# Patient Record
Sex: Female | Born: 1975
Health system: Southern US, Community
[De-identification: ages and names within clinical notes are randomized; demographics above are authoritative.]

## PROBLEM LIST (undated history)

## (undated) ENCOUNTER — Inpatient Hospital Stay (HOSPITAL_COMMUNITY): Payer: Self-pay

## (undated) DIAGNOSIS — D649 Anemia, unspecified: Secondary | ICD-10-CM

## (undated) HISTORY — PX: BREAST SURGERY: SHX581

## (undated) HISTORY — DX: Anemia, unspecified: D64.9

## (undated) HISTORY — PX: WISDOM TOOTH EXTRACTION: SHX21

---

## 1998-05-18 ENCOUNTER — Other Ambulatory Visit: Admission: RE | Admit: 1998-05-18 | Discharge: 1998-05-18 | Payer: Self-pay | Admitting: Gynecology

## 1999-12-26 ENCOUNTER — Other Ambulatory Visit: Admission: RE | Admit: 1999-12-26 | Discharge: 1999-12-26 | Payer: Self-pay | Admitting: Gynecology

## 2001-02-12 ENCOUNTER — Other Ambulatory Visit: Admission: RE | Admit: 2001-02-12 | Discharge: 2001-02-12 | Payer: Self-pay | Admitting: Gynecology

## 2002-03-05 ENCOUNTER — Other Ambulatory Visit: Admission: RE | Admit: 2002-03-05 | Discharge: 2002-03-05 | Payer: Self-pay | Admitting: Gynecology

## 2003-03-12 ENCOUNTER — Other Ambulatory Visit: Admission: RE | Admit: 2003-03-12 | Discharge: 2003-03-12 | Payer: Self-pay | Admitting: Gynecology

## 2003-12-07 ENCOUNTER — Other Ambulatory Visit: Admission: RE | Admit: 2003-12-07 | Discharge: 2003-12-07 | Payer: Self-pay | Admitting: Gynecology

## 2005-04-24 ENCOUNTER — Ambulatory Visit (HOSPITAL_COMMUNITY): Admission: RE | Admit: 2005-04-24 | Discharge: 2005-04-24 | Payer: Self-pay | Admitting: Obstetrics and Gynecology

## 2005-05-17 ENCOUNTER — Inpatient Hospital Stay (HOSPITAL_COMMUNITY): Admission: AD | Admit: 2005-05-17 | Discharge: 2005-05-17 | Payer: Self-pay | Admitting: Obstetrics and Gynecology

## 2005-06-11 ENCOUNTER — Inpatient Hospital Stay (HOSPITAL_COMMUNITY): Admission: AD | Admit: 2005-06-11 | Discharge: 2005-06-11 | Payer: Self-pay | Admitting: Obstetrics and Gynecology

## 2005-06-12 ENCOUNTER — Inpatient Hospital Stay (HOSPITAL_COMMUNITY): Admission: AD | Admit: 2005-06-12 | Discharge: 2005-06-12 | Payer: Self-pay | Admitting: Obstetrics and Gynecology

## 2005-06-16 ENCOUNTER — Inpatient Hospital Stay (HOSPITAL_COMMUNITY): Admission: AD | Admit: 2005-06-16 | Discharge: 2005-06-16 | Payer: Self-pay | Admitting: Obstetrics and Gynecology

## 2005-06-25 ENCOUNTER — Inpatient Hospital Stay (HOSPITAL_COMMUNITY): Admission: RE | Admit: 2005-06-25 | Discharge: 2005-06-28 | Payer: Self-pay | Admitting: Obstetrics and Gynecology

## 2005-06-26 ENCOUNTER — Encounter (INDEPENDENT_AMBULATORY_CARE_PROVIDER_SITE_OTHER): Payer: Self-pay | Admitting: *Deleted

## 2005-07-31 ENCOUNTER — Other Ambulatory Visit: Admission: RE | Admit: 2005-07-31 | Discharge: 2005-07-31 | Payer: Self-pay | Admitting: Obstetrics and Gynecology

## 2006-01-07 IMAGING — US US FETAL BPP W/O NONSTRESS
1 series · 14 of 20 positions shown · non-contrast
Comparison: none

CLINICAL DATA: 29-year-old.  Intrauterine growth retardation.  Biophysical profile.

[Series 1: us fetal bpp w/o nonstress · 0.41mm/px · 14 of 20 slices shown]
[im 1/20]
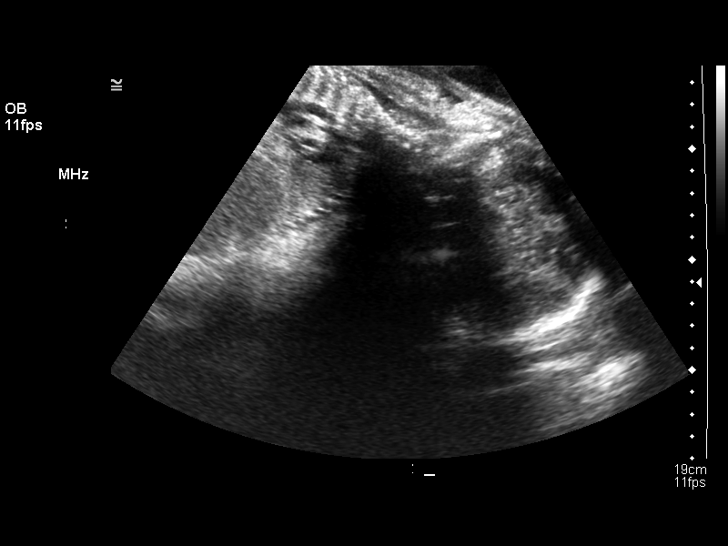
[im 3/20]
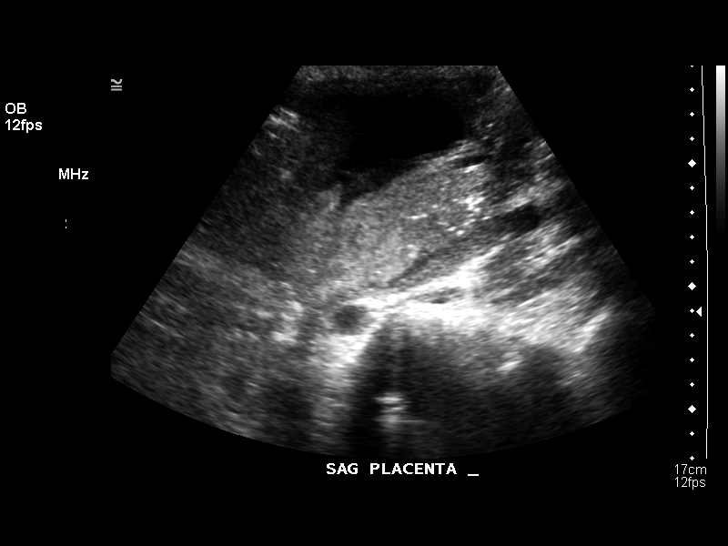
[im 4/20]
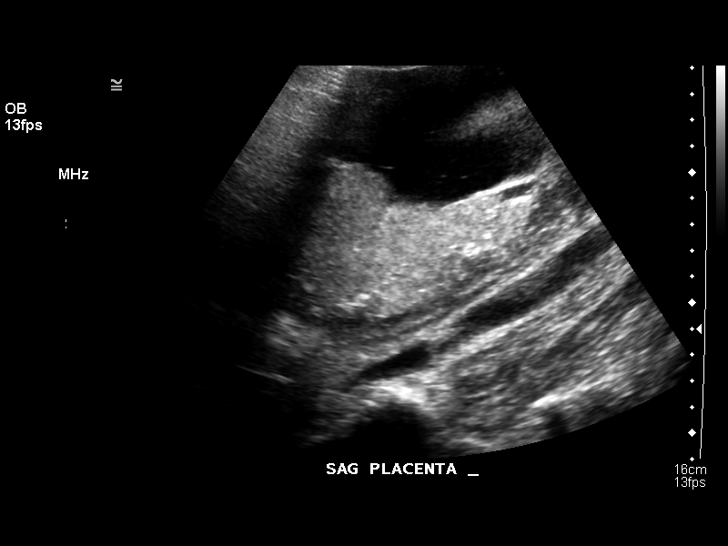
[im 6/20]
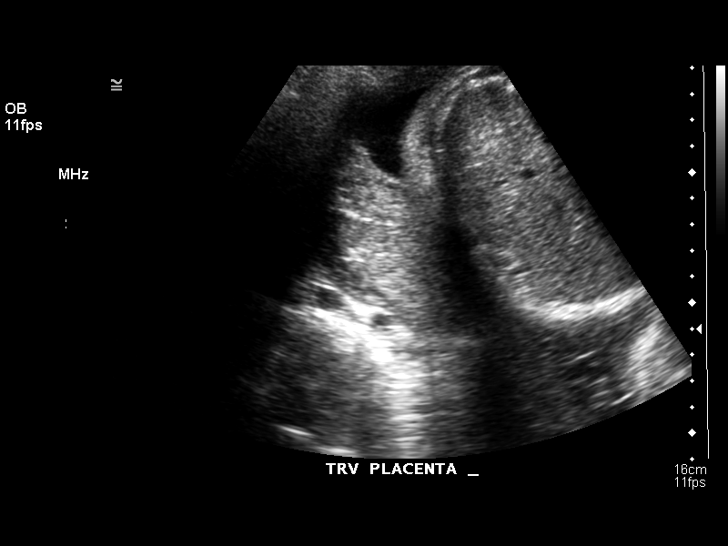
[im 7/20]
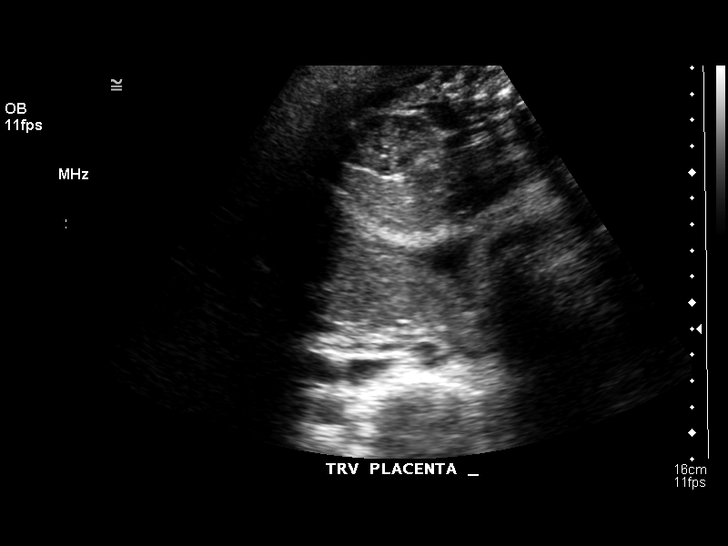
[im 8/20]
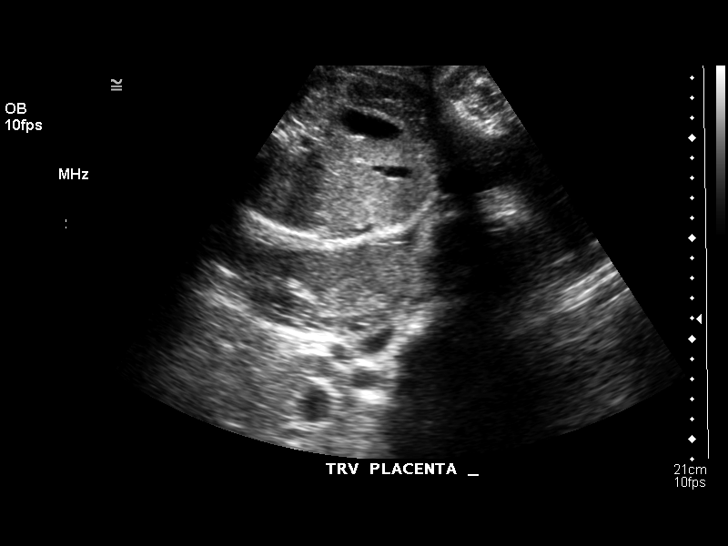
[im 10/20]
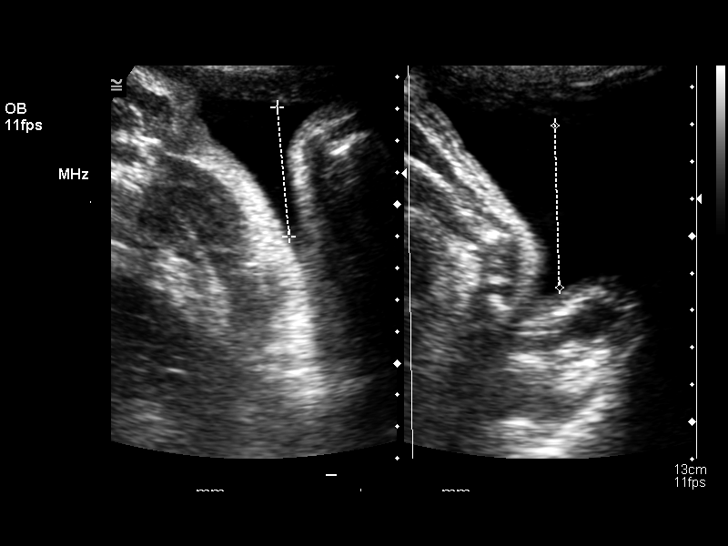
[im 11/20]
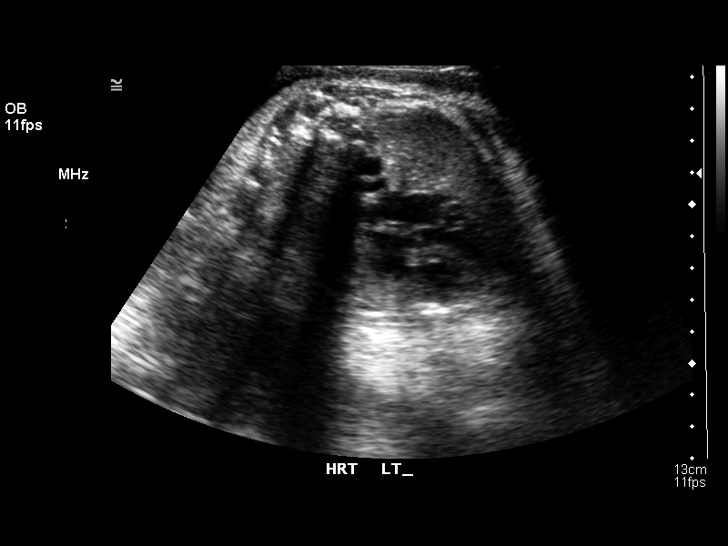
[im 13/20]
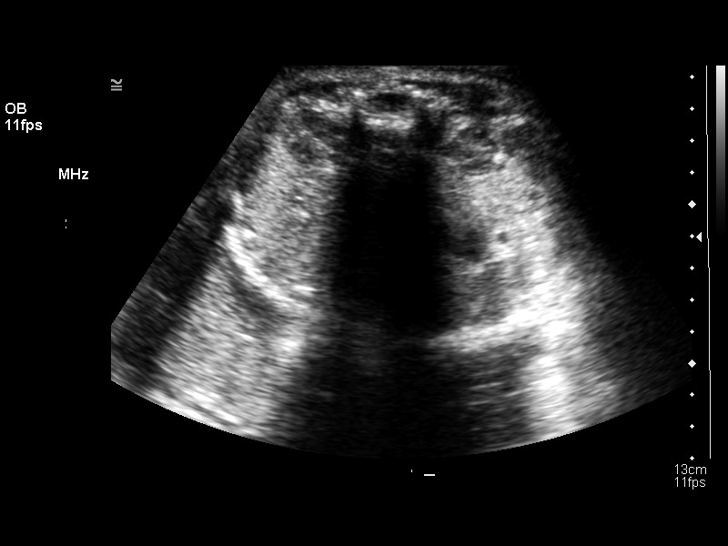
[im 14/20]
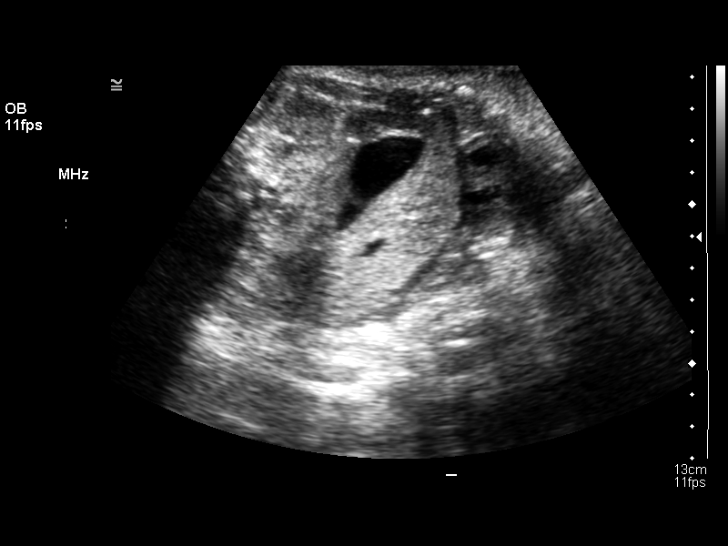
[im 16/20]
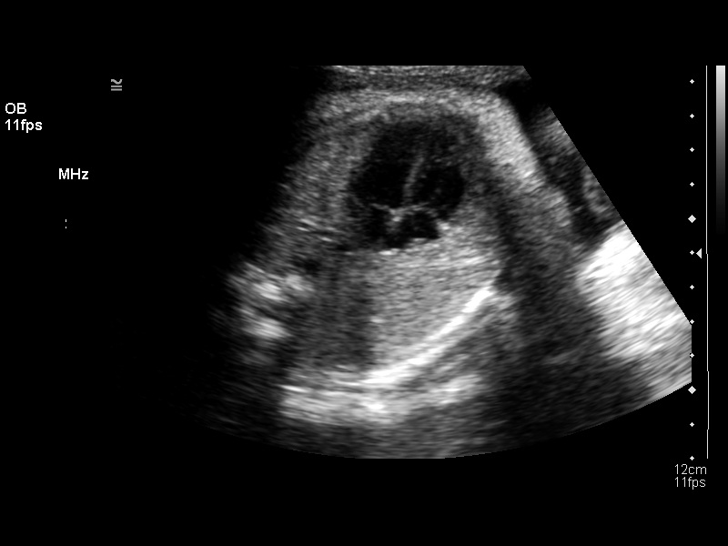
[im 17/20]
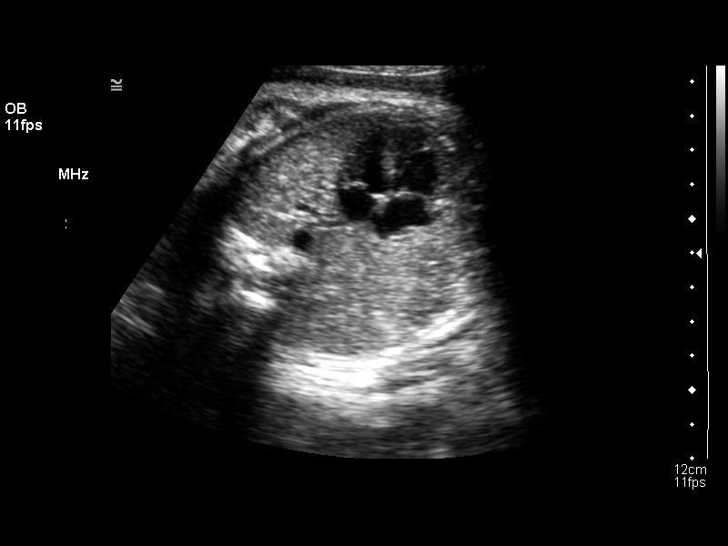
[im 18/20]
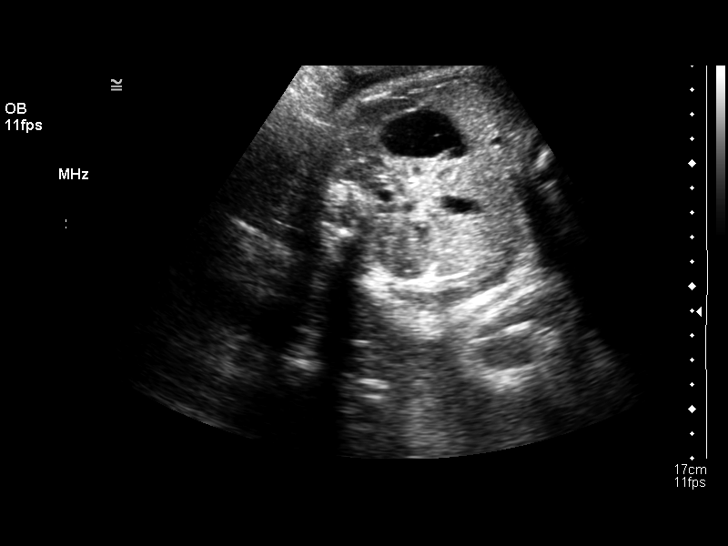
[im 20/20]
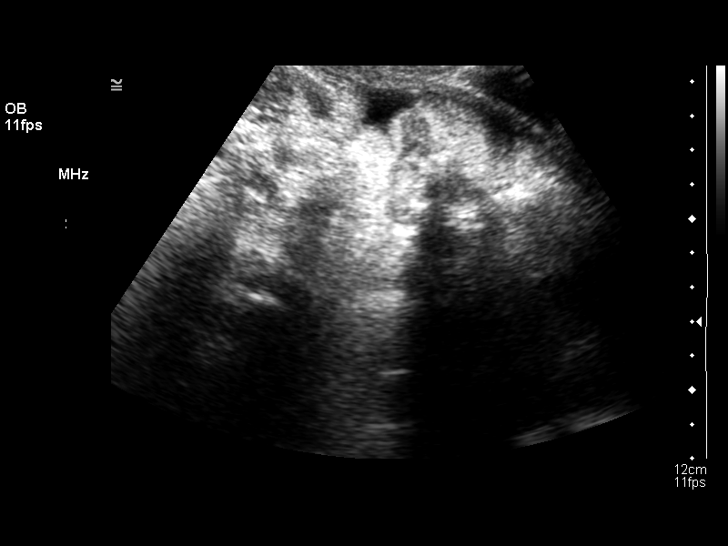

[14 of 20 positions shown; findings below may reference images not displayed]

BIOPHYSICAL PROFILE

 Number of Fetuses:  1
 Heart rate:  153
 Movement:  Yes
 Breathing:  Yes
 Presentation:  Cephalic
 Placental Location:  Posterior
 Grade:  I
 Previa:  No
 Amniotic Fluid (Subjective):  Normal
 Amniotic Fluid (Objective):  15.5 cm AFI (5th -95th%ile = 7.5 ? 24.4 cm for 37 wks)

 Fetal measurements and complete anatomic evaluation were not requested.  The following fetal anatomy was visualized on this exam:  Four chamber heart, stomach, kidneys, bladder and diaphragm.  

 BPP SCORING
 Movements:  2  Time:  20 minutes
 Breathing:  2
 Tone:  2
 Amniotic Fluid:  2
 Total Score:  8

 MATERNAL UTERINE AND ADNEXAL FINDINGS
 Cervix:  Not evaluated.
IMPRESSION: 1.  Biophysical profile score [DATE] at 20 minutes.
 2.  Living intrauterine fetus estimated at 36 weeks.

## 2006-01-11 IMAGING — US US OB FOLLOW-UP
1 series · 13 of 27 positions shown · non-contrast
Comparison: none

CLINICAL DATA: Assigned gestational age is 37 weeks 1 day; patient with UTI.

[Series 1: us ob follow-up · 0.31mm/px · 13 of 27 slices shown]
[im 2/27]
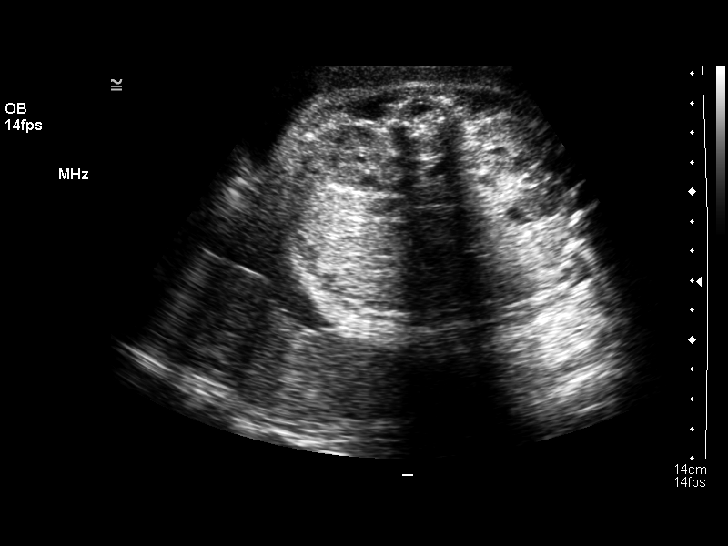
[im 4/27]
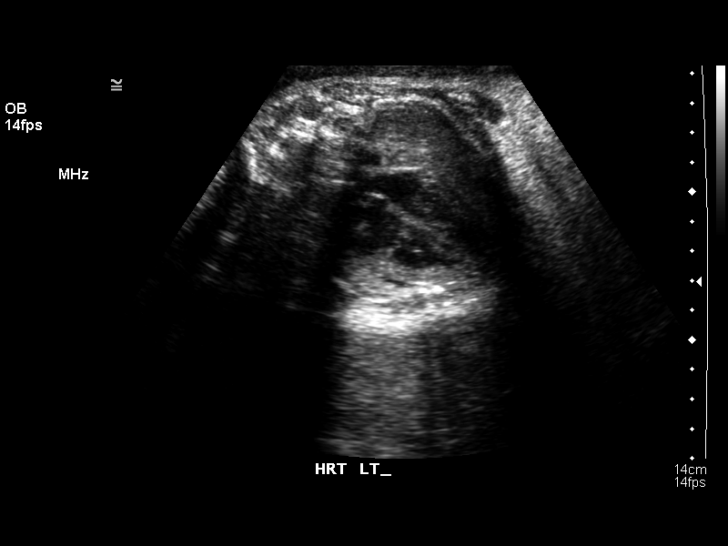
[im 6/27]
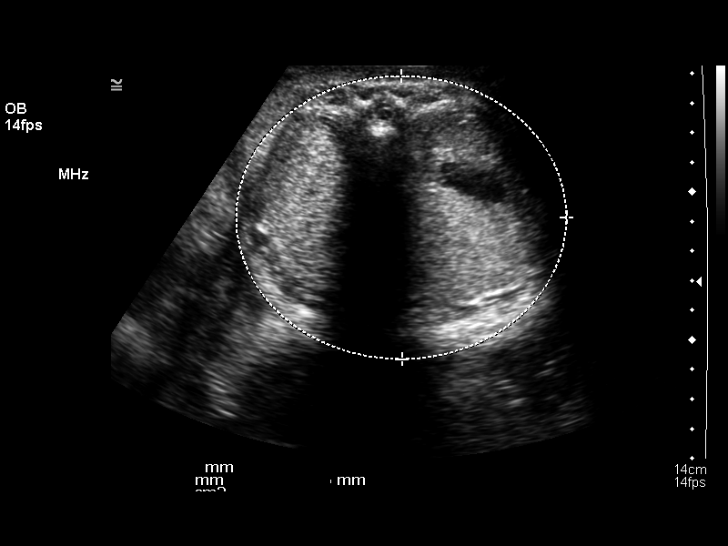
[im 8/27]
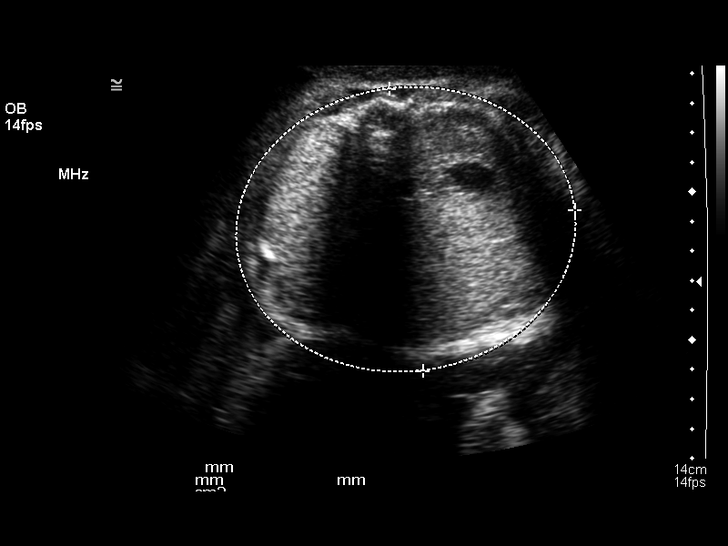
[im 10/27]
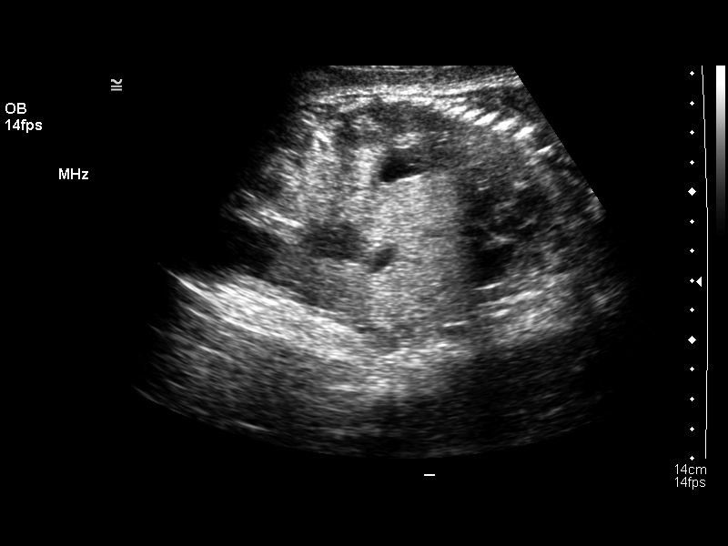
[im 12/27]
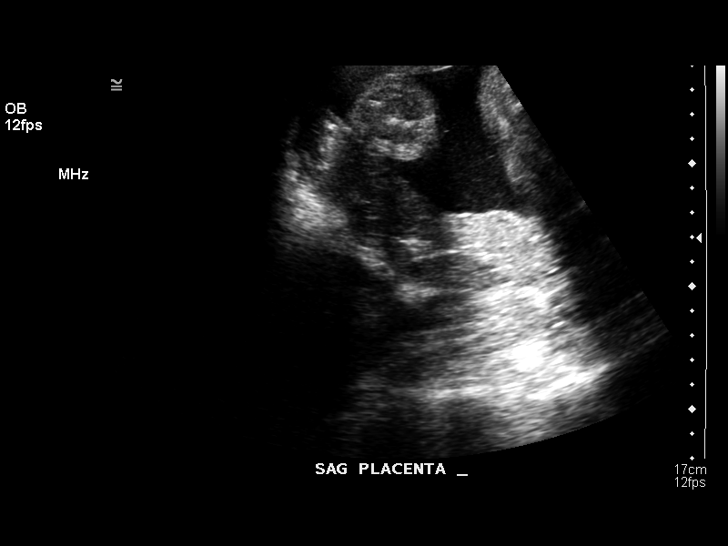
[im 14/27]
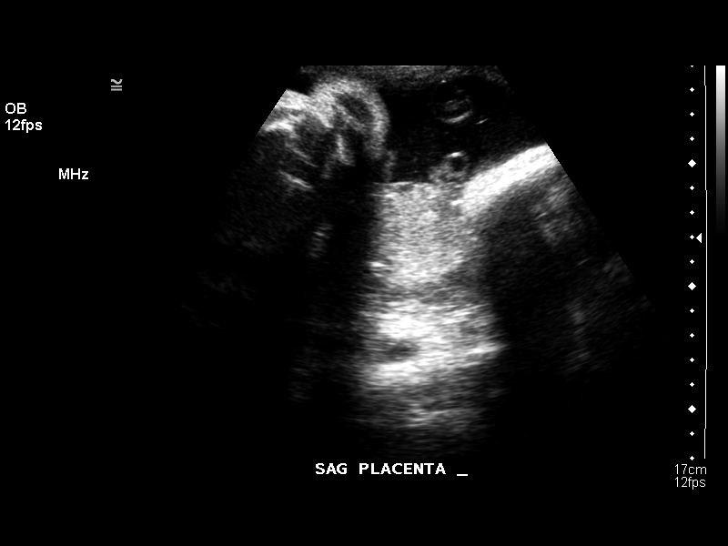
[im 16/27]
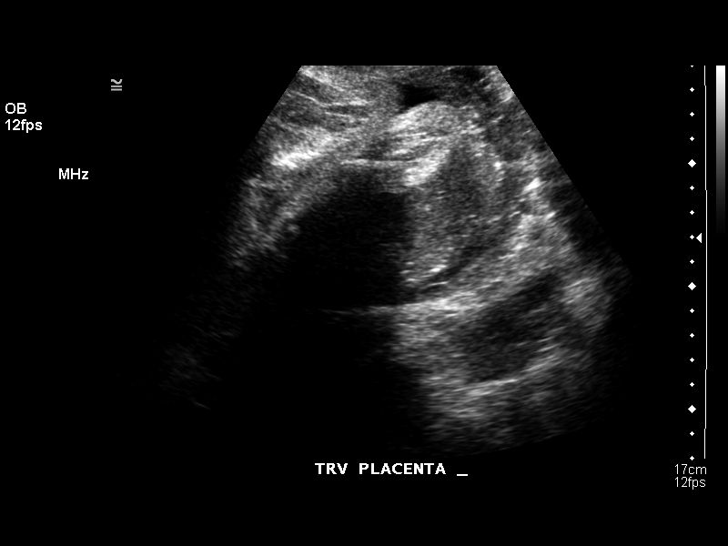
[im 18/27]
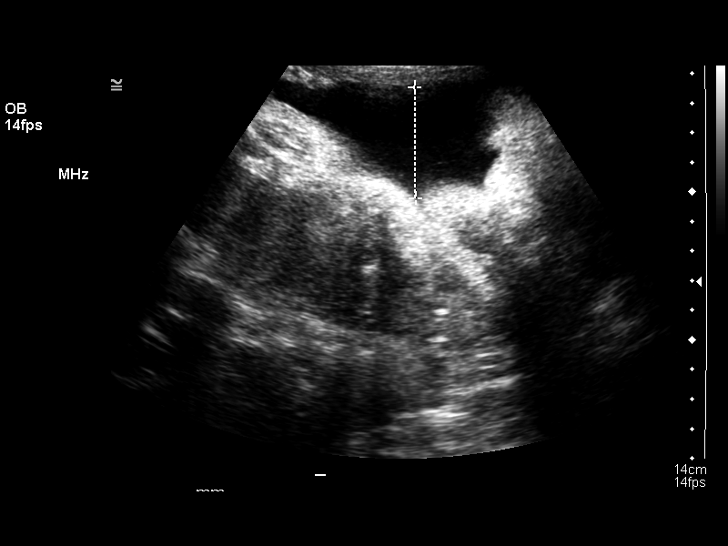
[im 20/27]
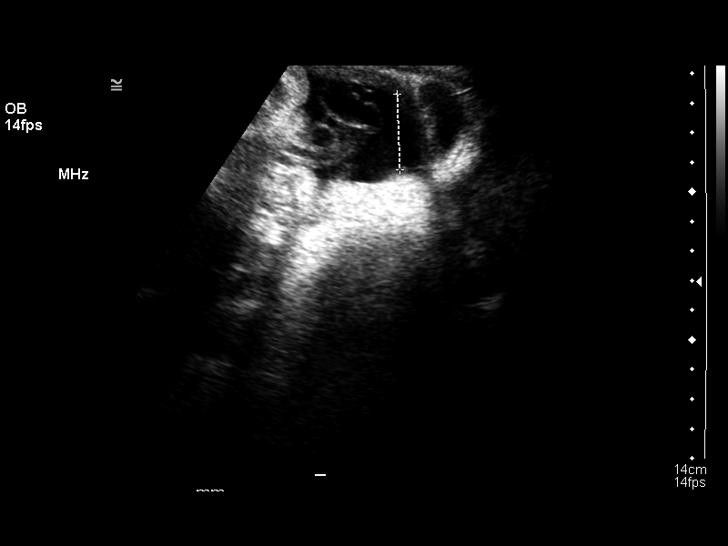
[im 22/27]
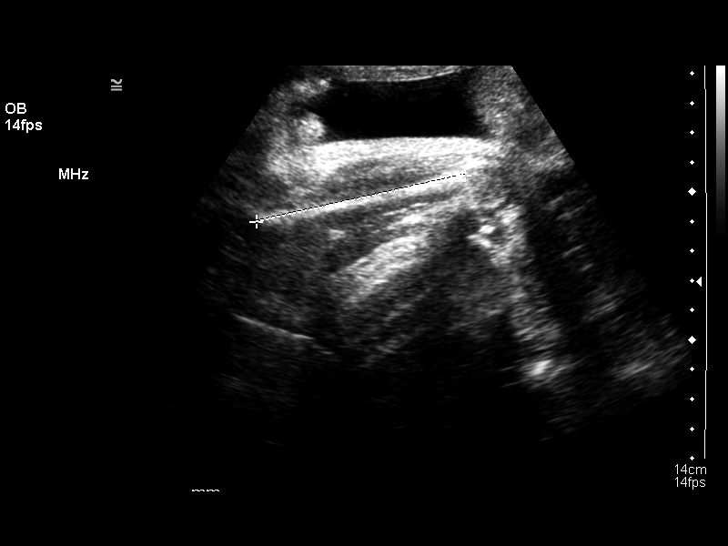
[im 24/27]
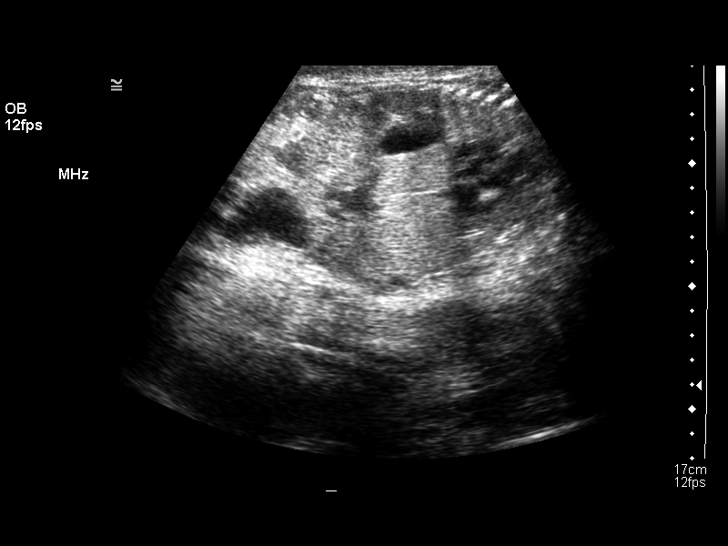
[im 26/27]
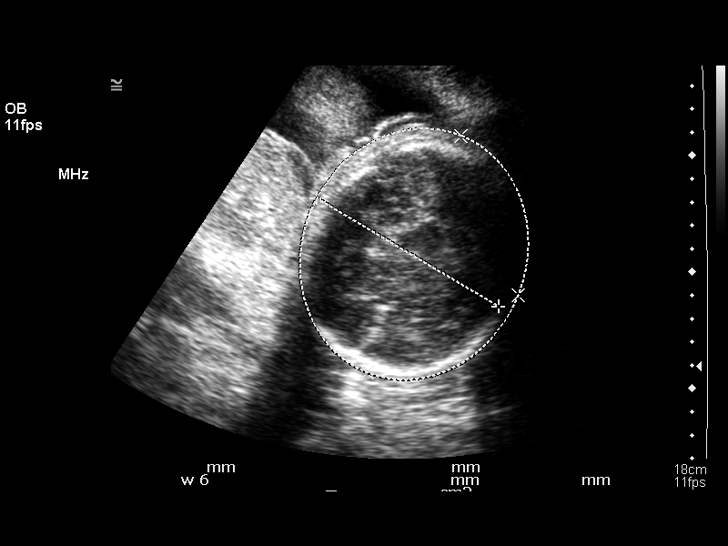

[13 of 27 positions shown; findings below may reference images not displayed]

OBSTETRICAL ULTRASOUND RE-EVALUATION:
 Number of Fetuses:  1
 Heart Rate:  139
 Movement:  Yes
 Breathing:  Yes
 Presentation:  Cephalic
 Placental Location:  Posterior
 Grade:  II
 Previa:  No
 Amniotic Fluid (subjective):  Normal
 Amniotic Fluid (objective):  10.7 cm AFI (5th -95th%ile = 7.5 – 24.4 cm for 37 wks)

 FETAL BIOMETRY
 BPD:  9.0 cm   36 w 4 d
 HC:  32.1 cm  36 w 1 d
 AC:  33.2 cm  37 w 0 d
 FL:   7.4 cm   37 w 5 d

 Mean GA:  36 w 6 d
 Assigned GA:  37 w 1 d

 EFW:  8029 g (H) 50th – 75th%ile (0126 – 5211 g) For 37 wks

 FETAL ANATOMY
 Lateral Ventricles:  Not visualized 
 Thalami/CSP:  Not visualized 
 Posterior Fossa:  Not visualized 
 Nuchal Region:  N/A
 Spine:  Not visualized 
 4 Chamber Heart on Left:  Previously seen 
 Stomach on Left:  Visualized 
 3 Vessel Cord:  Previously seen 
 Cord Insertion Site:  Not visualized 
 Kidneys:  Visualized 
 Bladder:  Visualized 
 Extremities:  Not visualized 

 Evaluation limited by:  Fetal position and advanced gestational age.

 MATERNAL UTERINE AND ADNEXAL FINDINGS
 Cervix:  Not evaluated.

 BIOPHYSICAL PROFILE

 Movement:  2    Time:  30 minutes
 Breathing:  2
 Tone:  2
 Amniotic Fluid:  2

 Total Score:  8
IMPRESSION: 1.  There is a single living intrauterine gestation in cephalic presentation.  The estimated fetal weight is 8029 + / - 462 grams which is at the 50th – 75th percentile for a 37 week gestation.  The fetal indices are concordant. 
 2.  The biophysical score is [DATE] over a 30 minute period.

## 2009-12-14 ENCOUNTER — Ambulatory Visit (HOSPITAL_COMMUNITY): Admission: RE | Admit: 2009-12-14 | Discharge: 2009-12-14 | Payer: Self-pay | Admitting: Obstetrics & Gynecology

## 2010-03-03 ENCOUNTER — Inpatient Hospital Stay (HOSPITAL_COMMUNITY): Admission: RE | Admit: 2010-03-03 | Discharge: 2010-03-03 | Payer: Self-pay | Admitting: Obstetrics & Gynecology

## 2010-05-13 ENCOUNTER — Inpatient Hospital Stay (HOSPITAL_COMMUNITY): Admission: RE | Admit: 2010-05-13 | Discharge: 2010-05-15 | Payer: Self-pay | Admitting: Obstetrics

## 2010-11-19 ENCOUNTER — Encounter: Payer: Self-pay | Admitting: Gynecology

## 2010-11-20 ENCOUNTER — Encounter: Payer: Self-pay | Admitting: Obstetrics

## 2011-01-14 LAB — CBC
HCT: 35.3 % — ABNORMAL LOW (ref 36.0–46.0)
Hemoglobin: 9.7 g/dL — ABNORMAL LOW (ref 12.0–15.0)
MCH: 33 pg (ref 26.0–34.0)
MCH: 33.6 pg (ref 26.0–34.0)
MCHC: 34.4 g/dL (ref 30.0–36.0)
MCV: 96 fL (ref 78.0–100.0)
MCV: 96.7 fL (ref 78.0–100.0)
RBC: 2.89 MIL/uL — ABNORMAL LOW (ref 3.87–5.11)
RDW: 13 % (ref 11.5–15.5)
WBC: 8.1 10*3/uL (ref 4.0–10.5)

## 2011-03-17 NOTE — Op Note (Signed)
NAMESANAAI, DOANE               ACCOUNT NO.:  [id number]   MEDICAL RECORD NO.:  [id number]          PATIENT TYPE:  INP   LOCATION:  9107                          FACILITY:  [facility]   PHYSICIAN:  Guy Sandifer. Henderson Cloud, M.D. DATE OF BIRTH:  1976-06-30   DATE OF PROCEDURE:  06/26/2005  DATE OF DISCHARGE:                                 OPERATIVE REPORT   PROCEDURE:  Vacuum extraction.   INDICATIONS AND CONSENT:  This patient is a 36 year old married white  female, G1, P0 with an EDC of July 06, 2005, placing her at 38-1/2  weeks' estimated gestational age.  She was admitted the previous evening for  a two-stage induction for a pregnancy complicated by poor weight gain,  prodromal labor, and history of depression.  Group B beta strep is positive,  and she is receiving antibiotics per protocol.  The patient received Cytotec  overnight.  The following morning, Pitocin is started per protocol.  She is  3 cm and requests pain relief, and an epidural is placed.  At approximately  9 a.m., she is noted to have some variable decelerations with contractions.  In preparing for an examination and artificial rupture of membranes, the bag  of waters is noted to be prolapsing through the introitus, and the vertex is  at +3 station.  The patient is then set up for delivery.  During this  process, the variable decelerations developed late components.  Artificial  rupture of membranes for clear fluid is carried out.  The patient pushes 2-3  times, and the vertex is essentially crowning.  The fetal heart rate remains  in the 80s.  Oxygen is being administered.  Due to the persistent fetal  bradycardia, vacuum extraction is recommended and discussed with the patient  and her husband.  One in 40,000 risk of severe morbidity or mortality is  discussed.  Kiwi vacuum extractor was placed and over the course of two  uterine contractions with a two-finger gentle pull, the vertex is delivered  without difficulty.   There are no pop-offs.  Oronasal pharynx was suctioned.  The remainder of the baby is delivered.  Viable female infant, Apgars of 9 and  9 at one and five minutes respectively is obtained.  Arterial cord pH and  birth weight are pending at time of dictation.  Placenta is three vessels  and intact and sent to pathology.  Cervix and vagina without laceration.  Second-degree midline episiotomy is repaired.  The patient and infant are  stable in the labor and delivery room.      Guy Sandifer Henderson Cloud, M.D.  Electronically Signed     JET/MEDQ  D:  06/26/2005  T:  06/26/2005  Job:  [id number]

## 2012-02-06 ENCOUNTER — Other Ambulatory Visit: Payer: Self-pay | Admitting: Plastic Surgery

## 2013-11-06 ENCOUNTER — Ambulatory Visit: Payer: Self-pay | Admitting: Obstetrics

## 2013-11-10 ENCOUNTER — Ambulatory Visit (INDEPENDENT_AMBULATORY_CARE_PROVIDER_SITE_OTHER): Payer: BC Managed Care – PPO | Admitting: Obstetrics

## 2013-11-10 ENCOUNTER — Encounter: Payer: Self-pay | Admitting: Obstetrics

## 2013-11-10 VITALS — BP 98/62 | HR 57 | Temp 98.4°F | Ht 67.0 in | Wt 100.0 lb

## 2013-11-10 DIAGNOSIS — Z01419 Encounter for gynecological examination (general) (routine) without abnormal findings: Secondary | ICD-10-CM

## 2013-11-10 DIAGNOSIS — Z30432 Encounter for removal of intrauterine contraceptive device: Secondary | ICD-10-CM

## 2013-11-10 DIAGNOSIS — Z3169 Encounter for other general counseling and advice on procreation: Secondary | ICD-10-CM

## 2013-11-10 DIAGNOSIS — Z0000 Encounter for general adult medical examination without abnormal findings: Secondary | ICD-10-CM

## 2013-11-10 NOTE — Progress Notes (Signed)
Subjective:     Alison Greer is a 38 y.o. female here for a routine exam.  Current complaints: Patient is in the office today for annual exam. Patient reports she has no problems today.  Desires removal of IUD.  Wants another child.   Gynecologic History No LMP recorded. Patient is not currently having periods (Reason: IUD). Contraception: Paragard Last Pap: 1 year. Results were: normal   Obstetric History OB History  No data available     The following portions of the patient's history were reviewed and updated as appropriate: allergies, current medications, past family history, past medical history, past social history, past surgical history and problem list.  Review of Systems Pertinent items are noted in HPI.    Objective:    General appearance: alert and no distress Breasts: normal appearance, no masses or tenderness, Normal to palpation without dominant masses Abdomen: normal findings: soft, non-tender Pelvic: cervix normal in appearance, external genitalia normal, no adnexal masses or tenderness, no cervical motion tenderness, rectovaginal septum normal, uterus normal size, shape, and consistency and vagina normal without discharge   IUD string visible and grasped with dressing forcep and removed, intact.   Assessment:    Healthy female exam.   IUD surveillance / removal.   Plan:    Doing well.  IUD removed.  F/U 1 year.  Preconception counseling done.

## 2013-11-10 NOTE — Addendum Note (Signed)
Addended by: Elby BeckPAUL, Jullianna Gabor F on: 11/10/2013 04:08 PM   Modules accepted: Orders

## 2013-11-11 LAB — WET PREP BY MOLECULAR PROBE
Candida species: NEGATIVE
Gardnerella vaginalis: POSITIVE — AB
Trichomonas vaginosis: NEGATIVE

## 2013-11-11 LAB — PAP IG W/ RFLX HPV ASCU

## 2013-11-11 LAB — GC/CHLAMYDIA PROBE AMP
CT Probe RNA: NEGATIVE
GC PROBE AMP APTIMA: NEGATIVE

## 2013-11-25 LAB — ANAEROBIC CULTURE
Gram Stain: NONE SEEN
Gram Stain: NONE SEEN

## 2013-12-02 ENCOUNTER — Other Ambulatory Visit: Payer: Self-pay | Admitting: *Deleted

## 2013-12-02 DIAGNOSIS — N760 Acute vaginitis: Principal | ICD-10-CM

## 2013-12-02 DIAGNOSIS — B9689 Other specified bacterial agents as the cause of diseases classified elsewhere: Secondary | ICD-10-CM

## 2013-12-02 MED ORDER — METRONIDAZOLE 500 MG PO TABS: 500.0000 mg | ORAL_TABLET | Freq: Two times a day (BID) | ORAL | Status: DC

## 2013-12-03 ENCOUNTER — Encounter: Payer: Self-pay | Admitting: Obstetrics

## 2015-03-01 ENCOUNTER — Ambulatory Visit: Payer: Self-pay | Admitting: Obstetrics

## 2015-03-22 ENCOUNTER — Encounter: Payer: Self-pay | Admitting: Obstetrics

## 2015-03-22 ENCOUNTER — Ambulatory Visit (INDEPENDENT_AMBULATORY_CARE_PROVIDER_SITE_OTHER): Payer: 59 | Admitting: Obstetrics

## 2015-03-22 VITALS — BP 96/54 | HR 74 | Temp 98.7°F | Ht 67.5 in | Wt 100.0 lb

## 2015-03-22 DIAGNOSIS — N898 Other specified noninflammatory disorders of vagina: Secondary | ICD-10-CM

## 2015-03-22 DIAGNOSIS — N911 Secondary amenorrhea: Secondary | ICD-10-CM

## 2015-03-22 LAB — POCT URINALYSIS DIPSTICK
BILIRUBIN UA: NEGATIVE
Blood, UA: NEGATIVE
GLUCOSE UA: NEGATIVE
KETONES UA: NEGATIVE
Leukocytes, UA: NEGATIVE
NITRITE UA: NEGATIVE
Protein, UA: NEGATIVE
Spec Grav, UA: <=1.005
Urobilinogen, UA: NEGATIVE
pH, UA: 7.5

## 2015-03-22 LAB — POCT URINE PREGNANCY: PREG TEST UR: POSITIVE

## 2015-03-22 NOTE — Progress Notes (Signed)
Patient ID: Alison Greer, female   DOB: 04/01/1976, 39 y.o.   MRN: [id number]  Chief Complaint  Patient presents with  . Problem    HPI Alison Greer is a 39 y.o. female.  No period since March.  Tired.  Urinary frequency.  HPI  History reviewed. No pertinent past medical history.  History reviewed. No pertinent past surgical history.  History reviewed. No pertinent family history.  Social History History  Substance Use Topics  . Smoking status: Never Smoker   . Smokeless tobacco: Not on file  . Alcohol Use: No     Comment: rare    Allergies  Allergen Reactions  . Azithromycin Nausea And Vomiting    Current Outpatient Prescriptions  Medication Sig Dispense Refill  . mirtazapine (REMERON) 15 MG tablet Take 15 mg by mouth at bedtime. Half of a tablet daily     No current facility-administered medications for this visit.    Review of Systems Review of Systems Constitutional: negative for fatigue and weight loss Respiratory: negative for cough and wheezing Cardiovascular: negative for chest pain, fatigue and palpitations Gastrointestinal: negative for abdominal pain and change in bowel habits Genitourinary:negative Integument/breast: negative for nipple discharge Musculoskeletal:negative for myalgias Neurological: negative for gait problems and tremors Behavioral/Psych: negative for abusive relationship, depression Endocrine: negative for temperature intolerance     Blood pressure 96/54, pulse 74, temperature 98.7 F (37.1 C), height 5' 7.5" (1.715 m), weight 100 lb (45.36 kg), last menstrual period 12/30/2014.  Physical Exam Physical Exam: Abdomen:  normal findings: no organomegaly, soft, non-tender and no hernia.  Negative FHR with Doppler.  Pelvis:  External genitalia: normal general appearance Urinary system: urethral meatus normal and bladder without fullness, nontender Vaginal: normal without tenderness, induration or masses Cervix: normal  appearance Adnexa: normal bimanual exam Uterus: anteverted and non-tender, soft, slightly enlarged      Data Reviewed UPT - positive  Assessment     Early pregnancy.  Unsure LMP.  AMA      Plan    Ultrasound ordered. F/U 2 weeks for NOB visit.

## 2015-03-22 NOTE — Addendum Note (Signed)
Addended by: Henriette CombsHATTON, Keilyn Haggard L on: 03/22/2015 04:42 PM   Modules accepted: Orders

## 2015-03-25 ENCOUNTER — Other Ambulatory Visit: Payer: 59

## 2015-03-25 ENCOUNTER — Ambulatory Visit (INDEPENDENT_AMBULATORY_CARE_PROVIDER_SITE_OTHER): Payer: 59

## 2015-03-25 ENCOUNTER — Other Ambulatory Visit: Payer: Self-pay | Admitting: Obstetrics

## 2015-03-25 DIAGNOSIS — N912 Amenorrhea, unspecified: Secondary | ICD-10-CM

## 2015-03-25 DIAGNOSIS — N911 Secondary amenorrhea: Secondary | ICD-10-CM

## 2015-03-25 LAB — US OB TRANSVAGINAL

## 2015-03-25 LAB — SURESWAB, VAGINOSIS/VAGINITIS PLUS
ATOPOBIUM VAGINAE: NOT DETECTED Log (cells/mL)
C. GLABRATA, DNA: NOT DETECTED
C. TRACHOMATIS RNA, TMA: NOT DETECTED
C. TROPICALIS, DNA: NOT DETECTED
C. albicans, DNA: NOT DETECTED
C. parapsilosis, DNA: NOT DETECTED
Gardnerella vaginalis: 7.9 Log (cells/mL)
LACTOBACILLUS SPECIES: NOT DETECTED Log (cells/mL)
MEGASPHAERA SPECIES: NOT DETECTED Log (cells/mL)
N. gonorrhoeae RNA, TMA: NOT DETECTED
T. VAGINALIS RNA, QL TMA: NOT DETECTED

## 2015-03-26 ENCOUNTER — Telehealth: Payer: Self-pay | Admitting: *Deleted

## 2015-03-26 ENCOUNTER — Other Ambulatory Visit: Payer: Self-pay | Admitting: Obstetrics

## 2015-03-26 DIAGNOSIS — B9689 Other specified bacterial agents as the cause of diseases classified elsewhere: Secondary | ICD-10-CM

## 2015-03-26 DIAGNOSIS — N760 Acute vaginitis: Principal | ICD-10-CM

## 2015-03-26 LAB — HCG, QUANTITATIVE, PREGNANCY: hCG, Beta Chain, Quant, S: 16201 m[IU]/mL

## 2015-03-26 MED ORDER — CLINDAMYCIN HCL 300 MG PO CAPS: 300.0000 mg | ORAL_CAPSULE | Freq: Three times a day (TID) | ORAL | Status: DC

## 2015-03-26 NOTE — Telephone Encounter (Signed)
Patient requested her Hcg results. Patient advised of test results. Patient advised Dr. Clearance CootsHarper would like for her to come in Tuesday or Wednesday to repeat the blood work. Patient advised that she would also have a follow up ultrasound in a couple weeks. Patient advised she should receive a call to set up that appointment. Patient verbalized understanding.

## 2015-03-31 ENCOUNTER — Encounter: Payer: Self-pay | Admitting: Obstetrics

## 2015-03-31 ENCOUNTER — Other Ambulatory Visit: Payer: 59

## 2015-03-31 DIAGNOSIS — Z3200 Encounter for pregnancy test, result unknown: Secondary | ICD-10-CM

## 2015-04-01 ENCOUNTER — Other Ambulatory Visit: Payer: Self-pay | Admitting: Obstetrics

## 2015-04-01 DIAGNOSIS — Z3687 Encounter for antenatal screening for uncertain dates: Secondary | ICD-10-CM

## 2015-04-01 LAB — HCG, QUANTITATIVE, PREGNANCY: hCG, Beta Chain, Quant, S: 58663.9 m[IU]/mL

## 2015-04-07 ENCOUNTER — Encounter: Payer: 59 | Admitting: Obstetrics

## 2015-04-08 ENCOUNTER — Ambulatory Visit (INDEPENDENT_AMBULATORY_CARE_PROVIDER_SITE_OTHER): Payer: 59

## 2015-04-08 ENCOUNTER — Ambulatory Visit (INDEPENDENT_AMBULATORY_CARE_PROVIDER_SITE_OTHER): Payer: 59 | Admitting: Obstetrics

## 2015-04-08 ENCOUNTER — Encounter: Payer: Self-pay | Admitting: Obstetrics

## 2015-04-08 VITALS — BP 102/69 | HR 87 | Temp 98.2°F | Wt 100.0 lb

## 2015-04-08 DIAGNOSIS — Z36 Encounter for antenatal screening of mother: Secondary | ICD-10-CM | POA: Diagnosis not present

## 2015-04-08 DIAGNOSIS — Z3481 Encounter for supervision of other normal pregnancy, first trimester: Secondary | ICD-10-CM

## 2015-04-08 DIAGNOSIS — O09521 Supervision of elderly multigravida, first trimester: Secondary | ICD-10-CM

## 2015-04-08 DIAGNOSIS — Z3687 Encounter for antenatal screening for uncertain dates: Secondary | ICD-10-CM

## 2015-04-08 LAB — US OB TRANSVAGINAL

## 2015-04-08 MED ORDER — OB COMPLETE GOLD 27.5-1-200 MG PO CAPS: 1.0000 | ORAL_CAPSULE | Freq: Every day | ORAL | Status: DC

## 2015-04-09 ENCOUNTER — Encounter: Payer: Self-pay | Admitting: Obstetrics

## 2015-04-09 LAB — OBSTETRIC PANEL
Antibody Screen: NEGATIVE
BASOS ABS: 0 10*3/uL (ref 0.0–0.1)
Basophils Relative: 0 % (ref 0–1)
Eosinophils Absolute: 0 10*3/uL (ref 0.0–0.7)
Eosinophils Relative: 1 % (ref 0–5)
HCT: 37.9 % (ref 36.0–46.0)
HEMOGLOBIN: 13 g/dL (ref 12.0–15.0)
Hepatitis B Surface Ag: NEGATIVE
LYMPHS ABS: 1.1 10*3/uL (ref 0.7–4.0)
LYMPHS PCT: 24 % (ref 12–46)
MCH: 31.1 pg (ref 26.0–34.0)
MCHC: 34.3 g/dL (ref 30.0–36.0)
MCV: 90.7 fL (ref 78.0–100.0)
MONO ABS: 0.3 10*3/uL (ref 0.1–1.0)
MONOS PCT: 6 % (ref 3–12)
MPV: 10.2 fL (ref 8.6–12.4)
NEUTROS ABS: 3 10*3/uL (ref 1.7–7.7)
Neutrophils Relative %: 69 % (ref 43–77)
PLATELETS: 167 10*3/uL (ref 150–400)
RBC: 4.18 MIL/uL (ref 3.87–5.11)
RDW: 13.1 % (ref 11.5–15.5)
RH TYPE: NEGATIVE
RUBELLA: 17.3 {index} — AB (ref <0.90)
WBC: 4.4 10*3/uL (ref 4.0–10.5)

## 2015-04-09 LAB — VITAMIN D 25 HYDROXY (VIT D DEFICIENCY, FRACTURES): Vit D, 25-Hydroxy: 37 ng/mL (ref 30–100)

## 2015-04-09 LAB — HIV ANTIBODY (ROUTINE TESTING W REFLEX): HIV: NONREACTIVE

## 2015-04-09 LAB — VARICELLA ZOSTER ANTIBODY, IGG: Varicella IgG: >4000 Index — ABNORMAL HIGH (ref <135.00)

## 2015-04-09 NOTE — Progress Notes (Signed)
Subjective:    Alison Greer is being seen today for her first obstetrical visit.  This is not a planned pregnancy. She is at [redacted]w[redacted]d gestation. Her obstetrical history is significant for advanced maternal age and low BMI. Relationship with FOB: unknown. Patient does intend to breast feed. Pregnancy history fully reviewed.  The information documented in the HPI was reviewed and verified.  Menstrual History: OB History    Gravida Para Term Preterm AB TAB SAB Ectopic Multiple Living   Patient's last menstrual period was 12/30/2014.    Past Medical History  Diagnosis Date  . Anemia     Past Surgical History  Procedure Laterality Date  . Breast surgery    . Wisdom tooth extraction       (Not in a hospital admission) Allergies  Allergen Reactions  . Azithromycin Nausea And Vomiting    History  Substance Use Topics  . Smoking status: Never Smoker   . Smokeless tobacco: Never Used  . Alcohol Use: No     Comment: rare    History reviewed. No pertinent family history.   Review of Systems Constitutional: positive for poor pre pregnancy weight Gastrointestinal: positive for vomiting Genitourinary:negative for genital lesions and vaginal discharge and dysuria Musculoskeletal:negative for back pain Behavioral/Psych: negative for abusive relationship, depression, illegal drug usage and tobacco use    Objective:    BP 102/69 mmHg  Pulse 87  Temp(Src) 98.2 F (36.8 C)  Wt 100 lb (45.36 kg)  LMP 12/30/2014 General Appearance:    Alert, cooperative, no distress, appears stated age  Head:    Normocephalic, without obvious abnormality, atraumatic  Eyes:    PERRL, conjunctiva/corneas clear, EOM's intact, fundi    benign, both eyes  Ears:    Normal TM's and external ear canals, both ears  Nose:   Nares normal, septum midline, mucosa normal, no drainage    or sinus tenderness  Throat:   Lips, mucosa, and tongue normal; teeth and gums normal  Neck:   Supple,  symmetrical, trachea midline, no adenopathy;    thyroid:  no enlargement/tenderness/nodules; no carotid   bruit or JVD  Back:     Symmetric, no curvature, ROM normal, no CVA tenderness  Lungs:     Clear to auscultation bilaterally, respirations unlabored  Chest Wall:    No tenderness or deformity   Heart:    Regular rate and rhythm, S1 and S2 normal, no murmur, rub   or gallop  Breast Exam:    No tenderness, masses, or nipple abnormality  Abdomen:     Soft, non-tender, bowel sounds active all four quadrants,    no masses, no organomegaly  Genitalia:    Normal female without lesion, discharge or tenderness  Extremities:   Extremities normal, atraumatic, no cyanosis or edema  Pulses:   2+ and symmetric all extremities  Skin:   Skin color, texture, turgor normal, no rashes or lesions  Lymph nodes:   Cervical, supraclavicular, and axillary nodes normal  Neurologic:   CNII-XII intact, normal strength, sensation and reflexes    throughout      Lab Review Urine pregnancy test Labs reviewed yes Radiologic studies reviewed yes Assessment:    Pregnancy at [redacted]w[redacted]d weeks    AMA Poor pre pregnancy weight   Plan:   Referred to MFM for AMA and concerns about weight and possible malnutrition    Prenatal vitamins.  Counseling provided regarding  continued use of seat belts, cessation of alcohol consumption, smoking or use of illicit drugs; infection precautions i.e., influenza/TDAP immunizations, toxoplasmosis,CMV, parvovirus, listeria and varicella; workplace safety, exercise during pregnancy; routine dental care, safe medications, sexual activity, hot tubs, saunas, pools, travel, caffeine use, fish and methlymercury, potential toxins, hair treatments, varicose veins Weight gain recommendations per IOM guidelines reviewed: underweight/BMI< 18.5--> gain 28 - 40 lbs; normal weight/BMI 18.5 - 24.9--> gain 25 - 35 lbs; overweight/BMI 25 - 29.9--> gain 15 - 25 lbs; obese/BMI >30->gain  11 - 20  lbs Problem list reviewed and updated. FIRST/CF mutation testing/NIPT/QUAD SCREEN/fragile X/Ashkenazi Jewish population testing/Spinal muscular atrophy discussed: requested. Role of ultrasound in pregnancy discussed; fetal survey: requested. Amniocentesis discussed: undecided.  Meds ordered this encounter  Medications  . Prenat w/o A-FeCbn-Meth-FA-DHA (OB COMPLETE GOLD) 27.5-1-200 MG CAPS    Sig: Take 1 capsule by mouth daily before breakfast.    Dispense:  90 capsule    Refill:  3   Orders Placed This Encounter  Procedures  . Culture, OB Urine  . [place] MFM Fetal Nuchal Translucency    Standing Status: Future     Number of Occurrences:      Standing Expiration Date: 06/07/2016    Order Specific Question:  Reason for Exam (SYMPTOM  OR DIAGNOSIS REQUIRED)    Answer:  AMA.  Low BMI.  Possible malnutrition.    Order Specific Question:  Preferred imaging location?    Answer:  [facility]Ul[facility]ound  . Obstetric panel  . HIV antibody  . Varicella zoster antibody, IgG  . Vit D  25 hydroxy (rtn osteoporosis monitoring)  . AMB Referral to Maternal Fetal Medicine (MFM)    Referral Priority:  Routine    Referral Type:  Consultation    Number of Visits Requested:  1    Follow up in 4 weeks.

## 2015-04-16 ENCOUNTER — Telehealth: Payer: Self-pay | Admitting: *Deleted

## 2015-04-16 NOTE — Telephone Encounter (Signed)
Patient called stating she is having diarrhea. Patient wants to know what she may take. Reviewed safe OTC meds with her. Patient advised to call next week if the diarrhea had not resolved. Patient verbalized understanding.

## 2015-04-22 ENCOUNTER — Telehealth: Payer: Self-pay | Admitting: *Deleted

## 2015-04-22 NOTE — Telephone Encounter (Signed)
Patient called office stating she has been having diarrhea daily. Patient states she has tried Imodium which only helps for a short time. Patient states she has also been running a fever.  Attempted to contact the patient and left message for patient to be evaluated at [facility].

## 2015-05-06 ENCOUNTER — Encounter: Payer: Self-pay | Admitting: Obstetrics

## 2015-05-06 ENCOUNTER — Ambulatory Visit (INDEPENDENT_AMBULATORY_CARE_PROVIDER_SITE_OTHER): Payer: 59 | Admitting: Obstetrics

## 2015-05-06 VITALS — BP 93/63 | HR 90 | Temp 98.6°F | Wt 98.0 lb

## 2015-05-06 DIAGNOSIS — Z3481 Encounter for supervision of other normal pregnancy, first trimester: Secondary | ICD-10-CM | POA: Diagnosis not present

## 2015-05-06 LAB — POCT URINALYSIS DIPSTICK
Bilirubin, UA: NEGATIVE
Glucose, UA: NEGATIVE
Ketones, UA: NEGATIVE
Leukocytes, UA: NEGATIVE
Nitrite, UA: NEGATIVE
PROTEIN UA: NEGATIVE
RBC UA: NEGATIVE
SPEC GRAV UA: 1.015
UROBILINOGEN UA: NEGATIVE
pH, UA: 6

## 2015-05-06 NOTE — Progress Notes (Signed)
  Subjective:    Alison Greer is a 39 y.o. female being seen today for her obstetrical visit. She is at 2533w1d gestation. Patient reports: no complaints.  Problem List Items Addressed This Visit    None    Visit Diagnoses    Encounter for supervision of other normal pregnancy in second trimester    -  Primary    Relevant Orders    POCT urinalysis dipstick (Completed)      There are no active problems to display for this patient.   Objective:     BP 93/63 mmHg  Pulse 90  Temp(Src) 98.6 F (37 C)  Wt 98 lb (44.453 kg)  LMP 12/30/2014 Uterine Size: Below umbilicus     Assessment:    Pregnancy @ 6833w1d  weeks Doing well    Plan:    Problem list reviewed and updated. Labs reviewed.  Follow up in 3 weeks. FIRST/CF mutation testing/NIPT/QUAD SCREEN/fragile X/Ashkenazi Jewish population testing/Spinal muscular atrophy discussed: requested. Role of ultrasound in pregnancy discussed; fetal survey: requested. Amniocentesis discussed: not indicated.

## 2015-05-14 ENCOUNTER — Encounter (HOSPITAL_COMMUNITY): Payer: Self-pay

## 2015-05-14 ENCOUNTER — Ambulatory Visit (HOSPITAL_COMMUNITY)
Admission: RE | Admit: 2015-05-14 | Discharge: 2015-05-14 | Disposition: A | Discharge status: Home / Self Care | Payer: 59 | Source: Ambulatory Visit | Attending: Obstetrics | Admitting: Obstetrics

## 2015-05-14 DIAGNOSIS — O09529 Supervision of elderly multigravida, unspecified trimester: Secondary | ICD-10-CM | POA: Insufficient documentation

## 2015-05-14 DIAGNOSIS — O09521 Supervision of elderly multigravida, first trimester: Secondary | ICD-10-CM | POA: Insufficient documentation

## 2015-05-14 DIAGNOSIS — R636 Underweight: Secondary | ICD-10-CM | POA: Insufficient documentation

## 2015-05-14 DIAGNOSIS — Z315 Encounter for genetic counseling: Secondary | ICD-10-CM | POA: Diagnosis not present

## 2015-05-14 DIAGNOSIS — Z3A12 12 weeks gestation of pregnancy: Secondary | ICD-10-CM | POA: Diagnosis not present

## 2015-05-14 NOTE — Progress Notes (Signed)
Genetic Counseling  High-Risk Gestation Note  Appointment Date:  05/14/2015 Referred By: Alison Greer, Alison A, MD Date of Birth:  01/16/1976   Pregnancy History: [id number] Estimated Date of Delivery: 11/24/15 Estimated Gestational Age: 5296w2d Attending: Alpha GulaPaul Whitecar, MD   [name] was seen for genetic counseling because of a maternal age of 39 y.o..     In Summary:   Advanced maternal age and association with fetal aneuploidy discussed  Patient elected NIPS today, declined invasive, diagnostic testing  NT ultrasound performed today  MFM consultation performed today separately  Detailed ultrasound scheduled 06/25/15  She was counseled regarding maternal age and the association with risk for chromosome conditions due to nondisjunction with aging of the ova. We reviewed chromosomes, nondisjunction, and the associated 1 in * risk for fetal aneuploidy related to a maternal age of 39 y.o. at 6996w2d gestation.  She was counseled that the risk for aneuploidy decreases as gestational age increases, accounting for those pregnancies which spontaneously abort.  We specifically discussed Down syndrome (trisomy 521), trisomies 6313 and 1718, and sex chromosome aneuploidies (47,XXX and 47,XXY) including the common features and prognoses of each.   We reviewed available screening options including First Screen, Quad screen, noninvasive prenatal screening (NIPS)/cell free DNA (cfDNA) testing, and detailed ultrasound.  She was counseled that screening tests are used to modify a patient's a priori risk for aneuploidy, typically based on age. This estimate provides a pregnancy specific risk assessment. We reviewed the benefits and limitations of each option. Specifically, we discussed the conditions for which each test screens, the detection rates, and false positive rates of each. She was also counseled regarding diagnostic testing via amniocentesis. We reviewed the approximate 1 in 300-500 risk for  complications for amniocentesis, including spontaneous pregnancy loss. After consideration of all the options, she  elected to proceed with NIPS ([facility]).  Those results will be available in 8-10 days.  She declined amniocentesis.   Nuchal translucency ultrasound was performed today.  The report will be documented separately.  The patient would like to return for a detailed ultrasound at ~18+ weeks gestation.  This appointment was scheduled today. She understands that screening tests cannot rule out all birth defects or genetic syndromes. The patient was advised of this limitation.   [name] was provided with written information regarding cystic fibrosis (CF) including the carrier frequency and incidence in the Caucasian population, the availability of carrier testing and prenatal diagnosis if indicated.  In addition, we discussed that CF is routinely screened for as part of the [place] newborn screening panel.  She declined CF testing today.   Both family histories were reviewed and found to be noncontributory for birth defects, intellectual disability, and known genetic conditions. Without further information regarding the provided family history, an accurate genetic risk cannot be calculated. Further genetic counseling is warranted if more information is obtained.  [name] denied exposure to environmental toxins or chemical agents. She denied the use of alcohol, tobacco or street drugs. She denied significant viral illnesses during the course of her pregnancy. Her medical and surgical histories were contributory. She is currently taking remeron during the pregnancy. See separate MFM consult note for detailed discussion regarding pregnancy management today.   I counseled [name] regarding the above risks and available options.  The approximate face-to-face time with the genetic counselor was 35 minutes.  Quinn PlowmanKaren Charmin Aguiniga, MS,  Certified Genetic  Counselor 05/14/2015

## 2015-05-14 NOTE — Consult Note (Signed)
Maternal Fetal Medicine Consultation  Requesting Provider(s): Coral Ceoharles Harper, MD  Reason for consultation: Advanced maternal age, underweight - poor nutrition  HPI: Alison Greer is a 39 yo G3P2002, EDD 11/23/2014 who is currently at 12w 2d seen today due to advanced maternal age and concerns of poor nutrition.  Ms. Alison Greer reports that her normal weight outside of pregnancy is approximately 100 lbs.  She denies any previous history of eating disorders (denies anorexia) or psychiatric disorders; however, she is currently on Remeron for "problems sleeping".  [facility] - [facility] are willing to supply her with Ensure or dietary supplements if her primary Obstetrician feels that this is needed.  Ms. Alison Greer's past OB history is remarkable for two prior vaginal deliveries at 1637 and 39 weeks.  Her first delivery was a vacuum assisted vaginal delivery.  She is without complaints today.    OB History: OB History    Gravida Para Term Preterm AB TAB SAB Ectopic Multiple Living   3 2 2       2       PMH:  Past Medical History  Diagnosis Date  . Anemia     PSH:  Past Surgical History  Procedure Laterality Date  . Breast surgery    . Wisdom tooth extraction     Meds:  Current Outpatient Prescriptions on File Prior to Encounter  Medication Sig Dispense Refill  . mirtazapine (REMERON) 15 MG tablet Take 15 mg by mouth at bedtime. Half of a tablet daily    . Omega-3 Fatty Acids (FISH OIL PO) Take by mouth.    Burnis Medin. Prenat w/o A-FeCbn-Meth-FA-DHA (OB COMPLETE GOLD) 27.5-1-200 MG CAPS Take 1 capsule by mouth daily before breakfast. (Patient not taking: Reported on 05/06/2015) 90 capsule 3  . Prenatal Vit-Fe Fumarate-FA (MULTIVITAMIN-PRENATAL) 27-0.8 MG TABS tablet Take 1 tablet by mouth daily at 12 noon.     No current facility-administered medications on file prior to encounter.   Allergies:  Allergies  Allergen Reactions  . Azithromycin Nausea And Vomiting   FH:  History    Social History  . Marital Status: Married    Spouse Name: N/A  . Number of Children: N/A  . Years of Education: N/A   Occupational History  . Not on file.   Social History Main Topics  . Smoking status: Never Smoker   . Smokeless tobacco: Never Used  . Alcohol Use: No     Comment: rare  . Drug Use: No  . Sexual Activity:    Partners: Male    Birth Control/ Protection: None   Other Topics Concern  . Not on file   Social History Narrative   Soc:  History   Social History  . Marital Status: Married    Spouse Name: N/A  . Number of Children: N/A  . Years of Education: N/A   Occupational History  . Not on file.   Social History Main Topics  . Smoking status: Never Smoker   . Smokeless tobacco: Never Used  . Alcohol Use: No     Comment: rare  . Drug Use: No  . Sexual Activity:    Partners: Male    Birth Control/ Protection: None   Other Topics Concern  . Not on file   Social History Narrative    PE:  Wt: 99.6 lbs, 99/64, 79  GEN: well-appearing female ABD: gravid, NT  Ultrasound: Single IUP at 12w 2d Normal NT (1.4 mm).  Nasal bone present.  A/P:  1) Single IUP at 12w 2d  2) Poor nutritional status - we reviewed the IOM guidelines for weight gain in pregnancy. For underweight women -BMI <18.5 kg/m2 (underweight) - weight gain 28 to 40 lbs (12.5 to 18.0 kg).  Poor weight gain could put her at higher risk for an SGA infant.  While the patient denies any history of eating disorders or psychiatric disease, Remeron is typically used for severe depression and not as a sleeping aid.  Remeron is not felt to be associated with a higher risk of fetal anomalies.  Nevertheless, feel that this warrants further investigation.  If she is not currently under the care of a mental health expert, feel that it would be reasonable for her to be seen in consultation - they would be much better equipped to evaluate for a possible eating disorder.  Additionally, would also  recommend a formal nutrition consult - this might most easily be arranged through [facility] - feel that nutritional supplements (Ensure, etc) would be warranted.  3) Advanced maternal age - see separate note from [employer], [employer]film[employer]video.  Recommend ultrasound for anatomy at 18 weeks.  Given the patient's poor nutritional status, would also recommend serial growth studies.  Recommendations: 1) Ultrasound for anatomy at 18 weeks.  Serial growth studies every 4 weeks after approximately [redacted] weeks gestation. 2) Nutrition consult - recommend nutritional supplements if available through [facility] 3) Recommend follow up with Mental health if she is currently being seen by them (? Remeron).  If not, would recommend a Psychiatry consultation - this may be the best way to address a possible eating disorder with the patient.   Thank you for the opportunity to be a part of the care of Alison Greer. Please contact our office if we can be of further assistance.   I spent approximately 30 minutes with this patient with over 50% of time spent in face-to-face counseling.  Alpha Gula, MD Maternal Fetal Medicine

## 2015-05-18 ENCOUNTER — Other Ambulatory Visit: Payer: Self-pay | Admitting: Obstetrics

## 2015-05-18 DIAGNOSIS — O2692 Pregnancy related conditions, unspecified, second trimester: Secondary | ICD-10-CM

## 2015-05-18 DIAGNOSIS — Z3A18 18 weeks gestation of pregnancy: Secondary | ICD-10-CM

## 2015-05-18 DIAGNOSIS — Z3689 Encounter for other specified antenatal screening: Secondary | ICD-10-CM

## 2015-05-18 DIAGNOSIS — O09522 Supervision of elderly multigravida, second trimester: Secondary | ICD-10-CM

## 2015-05-21 ENCOUNTER — Other Ambulatory Visit (HOSPITAL_COMMUNITY): Payer: Self-pay | Admitting: Obstetrics

## 2015-05-21 ENCOUNTER — Telehealth (HOSPITAL_COMMUNITY): Payer: Self-pay | Admitting: MS"

## 2015-05-21 NOTE — Telephone Encounter (Signed)
Left message for patient to return call.   Alison Greer 05/21/2015 9:01 AM

## 2015-05-21 NOTE — Telephone Encounter (Signed)
Called Alison Greer to discuss her cell free DNA test results.  Ms. SARANN TREGRE had Panorama testing through [facility].  Testing was offered because of advanced maternal age.   The patient was identified by name and DOB.  We reviewed that these are within normal limits, showing a less than 1 in 10,000 risk for trisomies 21, 18 and 13, and monosomy X (Turner syndrome).  In addition, the risk for triploidy/vanishing twin and sex chromosome trisomies (47,XXX and 47,XXY) was also low risk.  We reviewed that this testing identifies > 99% of pregnancies with trisomy 75, trisomy 66, sex chromosome trisomies (47,XXX and 47,XXY), and triploidy. The detection rate for trisomy 18 is 96%.  The detection rate for monosomy X is ~92%.  The false positive rate is <0.1% for all conditions. Testing was also consistent with female fetal sex.  The patient did wish to know fetal sex.  She understands that this testing does not identify all genetic conditions.  All questions were answered to her satisfaction, she was encouraged to call with additional questions or concerns.  Quinn Plowman, MS Certified Genetic Counselor 05/21/2015 12:52 PM

## 2015-05-27 ENCOUNTER — Ambulatory Visit (INDEPENDENT_AMBULATORY_CARE_PROVIDER_SITE_OTHER): Payer: 59 | Admitting: Obstetrics

## 2015-05-27 ENCOUNTER — Encounter: Payer: Self-pay | Admitting: Obstetrics

## 2015-05-27 VITALS — BP 99/66 | HR 84 | Temp 99.1°F | Wt 99.8 lb

## 2015-05-27 DIAGNOSIS — Z3482 Encounter for supervision of other normal pregnancy, second trimester: Secondary | ICD-10-CM

## 2015-05-27 LAB — POCT URINALYSIS DIPSTICK
BILIRUBIN UA: NEGATIVE
Blood, UA: NEGATIVE
Glucose, UA: NEGATIVE
Ketones, UA: NEGATIVE
LEUKOCYTES UA: NEGATIVE
Nitrite, UA: NEGATIVE
Protein, UA: NEGATIVE
Spec Grav, UA: 1.015
Urobilinogen, UA: NEGATIVE
pH, UA: 6

## 2015-05-27 NOTE — Progress Notes (Signed)
  Subjective:    Alison Greer is a 39 y.o. female being seen today for her obstetrical visit. She is at [redacted]w[redacted]d gestation. Patient reports: no complaints.  Problem List Items Addressed This Visit    None     Patient Active Problem List   Diagnosis Date Noted  . Advanced maternal age in multigravida   . Underweight   . [redacted] weeks gestation of pregnancy     Objective:     BP 99/66 mmHg  Pulse 84  Temp(Src) 99.1 F (37.3 C)  Wt 99 lb 12.8 oz (45.269 kg)  LMP 12/30/2014 Uterine Size: Below umbilicus     Assessment:    Pregnancy @ [redacted]w[redacted]d  weeks Doing well    Plan:    Problem list reviewed and updated. Labs reviewed.  Follow up in 4 weeks. FIRST/CF mutation testing/NIPT/QUAD SCREEN/fragile X/Ashkenazi Jewish population testing/Spinal muscular atrophy discussed: requested. Role of ultrasound in pregnancy discussed; fetal survey: requested. Amniocentesis discussed: declined.

## 2015-06-01 ENCOUNTER — Encounter: Payer: Self-pay | Admitting: *Deleted

## 2015-06-23 ENCOUNTER — Ambulatory Visit (INDEPENDENT_AMBULATORY_CARE_PROVIDER_SITE_OTHER): Payer: 59 | Admitting: Obstetrics

## 2015-06-23 ENCOUNTER — Encounter: Payer: Self-pay | Admitting: Obstetrics

## 2015-06-23 VITALS — BP 98/63 | HR 76 | Temp 98.7°F | Wt 104.0 lb

## 2015-06-23 DIAGNOSIS — Z3482 Encounter for supervision of other normal pregnancy, second trimester: Secondary | ICD-10-CM

## 2015-06-23 LAB — POCT URINALYSIS DIPSTICK
Bilirubin, UA: NEGATIVE
Glucose, UA: NEGATIVE
LEUKOCYTES UA: NEGATIVE
NITRITE UA: NEGATIVE
PH UA: 6.5
PROTEIN UA: NEGATIVE
RBC UA: NEGATIVE
Spec Grav, UA: 1.01
UROBILINOGEN UA: NEGATIVE

## 2015-06-23 NOTE — Progress Notes (Signed)
Subjective:    Alison Greer is a 39 y.o. female being seen today for her obstetrical visit. She is at [redacted]w[redacted]d gestation. Patient reports: no complaints . Fetal movement: normal.  Problem List Items Addressed This Visit    None    Visit Diagnoses    Encounter for supervision of other normal pregnancy in second trimester    -  Primary    Relevant Orders    POCT urinalysis dipstick (Completed)      Patient Active Problem List   Diagnosis Date Noted  . Advanced maternal age in multigravida   . Underweight   . [redacted] weeks gestation of pregnancy    Objective:    BP 98/63 mmHg  Pulse 76  Temp(Src) 98.7 F (37.1 C)  Wt 104 lb (47.174 kg)  LMP 12/30/2014 FHT: 150 BPM  Uterine Size: size equals dates     Assessment:    Pregnancy @ [redacted]w[redacted]d    Plan:    OBGCT: discussed.  Labs, problem list reviewed and updated 2 hr GTT planned Follow up in 4 weeks.

## 2015-06-24 ENCOUNTER — Ambulatory Visit: Payer: 59 | Admitting: Obstetrics

## 2015-06-25 ENCOUNTER — Ambulatory Visit (HOSPITAL_COMMUNITY)
Admission: RE | Admit: 2015-06-25 | Discharge: 2015-06-25 | Disposition: A | Discharge status: Home / Self Care | Payer: 59 | Source: Ambulatory Visit | Attending: Obstetrics | Admitting: Obstetrics

## 2015-06-25 ENCOUNTER — Encounter (HOSPITAL_COMMUNITY): Payer: Self-pay

## 2015-06-25 VITALS — BP 100/65 | HR 76 | Wt 103.0 lb

## 2015-06-25 DIAGNOSIS — Z3A18 18 weeks gestation of pregnancy: Secondary | ICD-10-CM | POA: Diagnosis not present

## 2015-06-25 DIAGNOSIS — O09522 Supervision of elderly multigravida, second trimester: Secondary | ICD-10-CM | POA: Diagnosis not present

## 2015-06-25 DIAGNOSIS — O2692 Pregnancy related conditions, unspecified, second trimester: Secondary | ICD-10-CM | POA: Diagnosis not present

## 2015-06-25 DIAGNOSIS — Z3689 Encounter for other specified antenatal screening: Secondary | ICD-10-CM

## 2015-06-25 DIAGNOSIS — Z36 Encounter for antenatal screening of mother: Secondary | ICD-10-CM | POA: Diagnosis not present

## 2015-06-25 DIAGNOSIS — O09529 Supervision of elderly multigravida, unspecified trimester: Secondary | ICD-10-CM

## 2015-07-09 ENCOUNTER — Telehealth: Payer: Self-pay | Admitting: *Deleted

## 2015-07-09 NOTE — Telephone Encounter (Signed)
Patient wanted to know if it was okay to use nasocort and a decongestant while pregnant. Patient advised it is okay.

## 2015-07-14 ENCOUNTER — Other Ambulatory Visit: Payer: Self-pay | Admitting: Certified Nurse Midwife

## 2015-07-21 ENCOUNTER — Ambulatory Visit (INDEPENDENT_AMBULATORY_CARE_PROVIDER_SITE_OTHER): Payer: 59 | Admitting: Obstetrics

## 2015-07-21 VITALS — BP 94/60 | HR 69 | Wt 107.0 lb

## 2015-07-21 DIAGNOSIS — Z23 Encounter for immunization: Secondary | ICD-10-CM

## 2015-07-21 DIAGNOSIS — Z331 Pregnant state, incidental: Secondary | ICD-10-CM

## 2015-07-21 DIAGNOSIS — Z3482 Encounter for supervision of other normal pregnancy, second trimester: Secondary | ICD-10-CM

## 2015-07-21 DIAGNOSIS — O09522 Supervision of elderly multigravida, second trimester: Secondary | ICD-10-CM

## 2015-07-21 DIAGNOSIS — Z1389 Encounter for screening for other disorder: Secondary | ICD-10-CM

## 2015-07-21 DIAGNOSIS — Z3A22 22 weeks gestation of pregnancy: Secondary | ICD-10-CM

## 2015-07-21 LAB — POCT URINALYSIS DIPSTICK
Bilirubin, UA: NEGATIVE
Blood, UA: NEGATIVE
GLUCOSE UA: NEGATIVE
LEUKOCYTES UA: NEGATIVE
NITRITE UA: NEGATIVE
PROTEIN UA: NEGATIVE
UROBILINOGEN UA: NEGATIVE
pH, UA: 6

## 2015-07-21 MED ORDER — INFLUENZA VAC SPLIT QUAD 0.5 ML IM SUSY
0.5000 mL | PREFILLED_SYRINGE | INTRAMUSCULAR | Status: AC
  Administered 2015-07-21: 0.5 mL via INTRAMUSCULAR

## 2015-07-22 ENCOUNTER — Encounter: Payer: Self-pay | Admitting: Obstetrics

## 2015-07-22 NOTE — Progress Notes (Signed)
Subjective:    Alison Greer is a 39 y.o. female being seen today for her obstetrical visit. She is at [redacted]w[redacted]d gestation. Patient reports: no complaints . Fetal movement: normal.  Problem List Items Addressed This Visit    None    Visit Diagnoses    Encounter for supervision of other normal pregnancy in second trimester    -  Primary    Relevant Medications    Influenza vac split quadrivalent PF (FLUARIX) injection 0.5 mL (Completed) (Start on 07/22/2015 10:00 AM)    Other Relevant Orders    POCT urinalysis dipstick (Completed)      Patient Active Problem List   Diagnosis Date Noted  . Advanced maternal age in multigravida   . Underweight   . [redacted] weeks gestation of pregnancy    Objective:    BP 94/60 mmHg  Pulse 69  Wt 107 lb (48.535 kg)  LMP 12/30/2014 FHT: 150 BPM  Uterine Size: size equals dates     Assessment:    Pregnancy @ [redacted]w[redacted]d    Plan:    OBGCT: discussed.  Labs, problem list reviewed and updated 2 hr GTT planned Follow up in 4 weeks.

## 2015-08-06 ENCOUNTER — Encounter (HOSPITAL_COMMUNITY): Payer: Self-pay

## 2015-08-06 ENCOUNTER — Ambulatory Visit (HOSPITAL_COMMUNITY)
Admission: RE | Admit: 2015-08-06 | Discharge: 2015-08-06 | Disposition: A | Discharge status: Home / Self Care | Payer: 59 | Source: Ambulatory Visit | Attending: Obstetrics | Admitting: Obstetrics

## 2015-08-06 DIAGNOSIS — O09529 Supervision of elderly multigravida, unspecified trimester: Secondary | ICD-10-CM | POA: Diagnosis present

## 2015-08-11 ENCOUNTER — Telehealth: Payer: Self-pay | Admitting: *Deleted

## 2015-08-11 NOTE — Telephone Encounter (Signed)
Patient thinks she has a yeast infection and wants to know what to do and what is safe for the baby. 10/12 10:00 LM on VM- may use OTC yeast medication if she wishes- ti is ok- if she feels she needs something a little stronger can call in a prescription cream. Let us know.

## 2015-08-18 ENCOUNTER — Ambulatory Visit (INDEPENDENT_AMBULATORY_CARE_PROVIDER_SITE_OTHER): Payer: 59 | Admitting: Obstetrics

## 2015-08-18 ENCOUNTER — Encounter: Payer: Self-pay | Admitting: Obstetrics

## 2015-08-18 VITALS — BP 109/62 | HR 81 | Wt 107.0 lb

## 2015-08-18 DIAGNOSIS — Z3483 Encounter for supervision of other normal pregnancy, third trimester: Secondary | ICD-10-CM

## 2015-08-18 LAB — POCT URINALYSIS DIPSTICK
BILIRUBIN UA: NEGATIVE
Blood, UA: NEGATIVE
Glucose, UA: NEGATIVE
LEUKOCYTES UA: NEGATIVE
Nitrite, UA: NEGATIVE
PH UA: 6.5
Protein, UA: NEGATIVE
Spec Grav, UA: <=1.005
Urobilinogen, UA: NEGATIVE

## 2015-08-18 NOTE — Progress Notes (Signed)
Subjective:    Alison Greer is a 39 y.o. female being seen today for her obstetrical visit. She is at 4714w0d gestation. Patient reports: no complaints . Fetal movement: normal.  Problem List Items Addressed This Visit    None     Patient Active Problem List   Diagnosis Date Noted  . Advanced maternal age in multigravida   . Underweight   . [redacted] weeks gestation of pregnancy    Objective:    BP 109/62 mmHg  Pulse 81  Wt 107 lb (48.535 kg)  LMP 12/30/2014 FHT: 150 BPM  Uterine Size: size equals dates     Assessment:    Pregnancy @ 3514w0d    Plan:    OBGCT: ordered for next visit.  Labs, problem list reviewed and updated 2 hr GTT planned Follow up in 2 weeks.

## 2015-08-18 NOTE — Addendum Note (Signed)
Addended by: Marya LandryFOSTER, Aquila Menzie D on: 08/18/2015 05:45 PM   Modules accepted: Orders

## 2015-08-18 NOTE — Progress Notes (Signed)
Pt states that she is having leg cramps each morning.

## 2015-08-25 ENCOUNTER — Telehealth: Payer: Self-pay | Admitting: *Deleted

## 2015-08-25 ENCOUNTER — Other Ambulatory Visit: Payer: Self-pay | Admitting: Obstetrics

## 2015-08-25 DIAGNOSIS — B3731 Acute candidiasis of vulva and vagina: Secondary | ICD-10-CM

## 2015-08-25 DIAGNOSIS — B373 Candidiasis of vulva and vagina: Secondary | ICD-10-CM

## 2015-08-25 MED ORDER — TERCONAZOLE 0.4 % VA CREA: 1.0000 | TOPICAL_CREAM | Freq: Every day | VAGINAL | Status: DC

## 2015-08-25 NOTE — Telephone Encounter (Signed)
Lets Rx Terazol Husband does not need treatment unless he is symptomatic.

## 2015-08-25 NOTE — Telephone Encounter (Signed)
Patient states she has persistent yeast- usually occuring after intercourse. She used OTC 3 day Monistat which helped- but did not completely get rid of symptoms. Patient would like to know what to do next and does her husband need treatment since this keeps happening?

## 2015-08-26 NOTE — Progress Notes (Signed)
Patient notified Rx at pharmacy 

## 2015-09-01 ENCOUNTER — Other Ambulatory Visit: Payer: 59

## 2015-09-01 ENCOUNTER — Encounter: Payer: Self-pay | Admitting: Obstetrics

## 2015-09-01 ENCOUNTER — Ambulatory Visit (INDEPENDENT_AMBULATORY_CARE_PROVIDER_SITE_OTHER): Payer: 59 | Admitting: Obstetrics

## 2015-09-01 VITALS — BP 92/61 | HR 81 | Temp 97.8°F | Wt 110.0 lb

## 2015-09-01 DIAGNOSIS — Z3483 Encounter for supervision of other normal pregnancy, third trimester: Secondary | ICD-10-CM

## 2015-09-01 DIAGNOSIS — N898 Other specified noninflammatory disorders of vagina: Secondary | ICD-10-CM

## 2015-09-01 LAB — CBC
HEMATOCRIT: 36 % (ref 36.0–46.0)
Hemoglobin: 11.9 g/dL — ABNORMAL LOW (ref 12.0–15.0)
MCH: 31.5 pg (ref 26.0–34.0)
MCHC: 33.1 g/dL (ref 30.0–36.0)
MCV: 95.2 fL (ref 78.0–100.0)
MPV: 10 fL (ref 8.6–12.4)
Platelets: 136 10*3/uL — ABNORMAL LOW (ref 150–400)
RBC: 3.78 MIL/uL — ABNORMAL LOW (ref 3.87–5.11)
RDW: 13 % (ref 11.5–15.5)
WBC: 5.3 10*3/uL (ref 4.0–10.5)

## 2015-09-01 LAB — POCT URINALYSIS DIPSTICK
Bilirubin, UA: NEGATIVE
Glucose, UA: NEGATIVE
Leukocytes, UA: NEGATIVE
Nitrite, UA: NEGATIVE
PH UA: 5.5
PROTEIN UA: NEGATIVE
RBC UA: NEGATIVE
SPEC GRAV UA: 1.015
Urobilinogen, UA: NEGATIVE

## 2015-09-01 NOTE — Progress Notes (Signed)
Subjective:    Alison Greer is a 39 y.o. female being seen today for her obstetrical visit. She is at 3469w0d gestation. Patient reports vaginal discharge.  Denies vaginal irritation, itching or odor.. Fetal movement: normal.  Problem List Items Addressed This Visit    None    Visit Diagnoses    Encounter for supervision of other normal pregnancy in third trimester    -  Primary    Relevant Orders    POCT urinalysis dipstick (Completed)    Glucose Tolerance, 2 Hours w/1 Hour    CBC    HIV antibody    RPR    SureSwab, Vaginosis/Vaginitis Plus      Patient Active Problem List   Diagnosis Date Noted  . Advanced maternal age in multigravida   . Underweight   . [redacted] weeks gestation of pregnancy    Objective:    BP 92/61 mmHg  Pulse 81  Temp(Src) 97.8 F (36.6 C)  Wt 110 lb (49.896 kg)  LMP 12/30/2014 FHT:  150 BPM  Uterine Size: size less than dates  Presentation: unsure   SSE:        Vaginal mucosa normal.  No abnormal discharge observed.  Cervix appears long and closed  Assessment:    Pregnancy @ 3969w0d weeks    Doing well.  Plan:     labs reviewed, problem list updated Consent signed. GBS sent TDAP offered  Rhogam given for RH negative Pediatrician: discussed. Infant feeding: plans to breastfeed. Maternity leave: discussed. Cigarette smoking: never smoked.  Orders Placed This Encounter  Procedures  . SureSwab, Vaginosis/Vaginitis Plus  . Glucose Tolerance, 2 Hours w/1 Hour  . CBC  . HIV antibody  . RPR  . POCT urinalysis dipstick   No orders of the defined types were placed in this encounter.   Follow up in 2 Weeks.

## 2015-09-02 LAB — GLUCOSE TOLERANCE, 2 HOURS W/ 1HR
GLUCOSE, FASTING: 64 mg/dL — AB (ref 65–99)
Glucose, 1 hour: 186 mg/dL — ABNORMAL HIGH (ref 70–170)
Glucose, 2 hour: 163 mg/dL — ABNORMAL HIGH (ref 70–139)

## 2015-09-02 LAB — HIV ANTIBODY (ROUTINE TESTING W REFLEX): HIV 1&2 Ab, 4th Generation: NONREACTIVE

## 2015-09-02 LAB — RPR

## 2015-09-07 ENCOUNTER — Other Ambulatory Visit: Payer: Self-pay | Admitting: Obstetrics

## 2015-09-07 DIAGNOSIS — N760 Acute vaginitis: Principal | ICD-10-CM

## 2015-09-07 DIAGNOSIS — B9689 Other specified bacterial agents as the cause of diseases classified elsewhere: Secondary | ICD-10-CM

## 2015-09-07 LAB — SURESWAB, VAGINOSIS/VAGINITIS PLUS
Atopobium vaginae: NOT DETECTED Log (cells/mL)
C. GLABRATA, DNA: NOT DETECTED
C. PARAPSILOSIS, DNA: NOT DETECTED
C. TROPICALIS, DNA: NOT DETECTED
C. albicans, DNA: NOT DETECTED
C. trachomatis RNA, TMA: NOT DETECTED
LACTOBACILLUS SPECIES: NOT DETECTED Log (cells/mL)
MEGASPHAERA SPECIES: NOT DETECTED Log (cells/mL)
N. GONORRHOEAE RNA, TMA: NOT DETECTED
T. vaginalis RNA, QL TMA: NOT DETECTED

## 2015-09-07 MED ORDER — METRONIDAZOLE 500 MG PO TABS: 500.0000 mg | ORAL_TABLET | Freq: Two times a day (BID) | ORAL | Status: DC

## 2015-09-08 ENCOUNTER — Other Ambulatory Visit: Payer: Self-pay | Admitting: Obstetrics

## 2015-09-08 DIAGNOSIS — O24419 Gestational diabetes mellitus in pregnancy, unspecified control: Secondary | ICD-10-CM

## 2015-09-13 ENCOUNTER — Ambulatory Visit: Payer: 59

## 2015-09-13 ENCOUNTER — Other Ambulatory Visit: Payer: Self-pay | Admitting: *Deleted

## 2015-09-13 DIAGNOSIS — O24410 Gestational diabetes mellitus in pregnancy, diet controlled: Secondary | ICD-10-CM

## 2015-09-13 MED ORDER — ACCU-CHEK AVIVA PLUS W/DEVICE KIT: 1.0000 | PACK | Freq: Once | Status: DC

## 2015-09-13 MED ORDER — ACCU-CHEK MULTICLIX LANCETS MISC: Status: DC

## 2015-09-13 MED ORDER — GLUCOSE BLOOD VI STRP
ORAL_STRIP | Status: DC
Start: 2015-09-13 — End: 2015-11-14

## 2015-09-15 ENCOUNTER — Encounter: Payer: 59 | Admitting: Obstetrics

## 2015-09-16 ENCOUNTER — Telehealth: Payer: Self-pay | Admitting: *Deleted

## 2015-09-16 NOTE — Telephone Encounter (Signed)
Patient contacted the office to report her recent blood sugars. Patient has a recent diagnosis of gestational diabetes. Patient states her blood sugars on 09-14-15 were 70 two hours after breakfast, 73 two hours after lunch and 95 one hour after dinner. Patient has an appointment with MFM on 09-17-15. There is some concern is patient is not getting enough carbs in her diet. After discuss with Dr. Clearance CootsHarper referral has been made to dietitian for diabetic counseling. Will continue to monitor patient's weight and glucose values and offer support during remainder of pregnancy.

## 2015-09-17 ENCOUNTER — Other Ambulatory Visit: Payer: Self-pay | Admitting: Obstetrics

## 2015-09-17 ENCOUNTER — Encounter (HOSPITAL_COMMUNITY): Payer: Self-pay

## 2015-09-17 ENCOUNTER — Ambulatory Visit (HOSPITAL_COMMUNITY)
Admission: RE | Admit: 2015-09-17 | Discharge: 2015-09-17 | Disposition: A | Discharge status: Home / Self Care | Payer: 59 | Source: Ambulatory Visit | Attending: Obstetrics | Admitting: Obstetrics

## 2015-09-17 DIAGNOSIS — O2613 Low weight gain in pregnancy, third trimester: Secondary | ICD-10-CM | POA: Diagnosis not present

## 2015-09-17 DIAGNOSIS — Z3A30 30 weeks gestation of pregnancy: Secondary | ICD-10-CM

## 2015-09-17 DIAGNOSIS — O09523 Supervision of elderly multigravida, third trimester: Secondary | ICD-10-CM

## 2015-09-17 DIAGNOSIS — O24419 Gestational diabetes mellitus in pregnancy, unspecified control: Secondary | ICD-10-CM

## 2015-09-17 DIAGNOSIS — O24410 Gestational diabetes mellitus in pregnancy, diet controlled: Secondary | ICD-10-CM | POA: Insufficient documentation

## 2015-09-20 ENCOUNTER — Ambulatory Visit (INDEPENDENT_AMBULATORY_CARE_PROVIDER_SITE_OTHER): Payer: 59 | Admitting: Obstetrics

## 2015-09-20 VITALS — BP 105/67 | HR 83 | Temp 98.6°F | Wt 111.0 lb

## 2015-09-20 DIAGNOSIS — O0993 Supervision of high risk pregnancy, unspecified, third trimester: Secondary | ICD-10-CM

## 2015-09-20 DIAGNOSIS — Z3483 Encounter for supervision of other normal pregnancy, third trimester: Secondary | ICD-10-CM

## 2015-09-20 LAB — POCT URINALYSIS DIPSTICK
Bilirubin, UA: NEGATIVE
Blood, UA: NEGATIVE
Glucose, UA: NEGATIVE
Leukocytes, UA: NEGATIVE
Nitrite, UA: NEGATIVE
PH UA: 6
PROTEIN UA: NEGATIVE
SPEC GRAV UA: 1.01
Urobilinogen, UA: NEGATIVE

## 2015-09-24 ENCOUNTER — Encounter: Payer: Self-pay | Admitting: Obstetrics

## 2015-09-24 ENCOUNTER — Other Ambulatory Visit (HOSPITAL_COMMUNITY): Payer: Self-pay

## 2015-09-24 NOTE — Progress Notes (Signed)
Subjective:    Alison Greer is a 39 y.o. female being seen today for her obstetrical visit. She is at 513w2d gestation. Patient reports no complaints. Fetal movement: normal.  Problem List Items Addressed This Visit    None    Visit Diagnoses    Supervision of high risk pregnancy in third trimester    -  Primary    Relevant Orders    POCT urinalysis dipstick (Completed)      Patient Active Problem List   Diagnosis Date Noted  . Advanced maternal age in multigravida   . Underweight   . [redacted] weeks gestation of pregnancy    Objective:    BP 105/67 mmHg  Pulse 83  Temp(Src) 98.6 F (37 C)  Wt 111 lb (50.349 kg)  LMP 12/30/2014 FHT:  150 BPM  Uterine Size: size equals dates  Presentation: unsure     Assessment:    Pregnancy @ 5013w2d weeks   Plan:     labs reviewed, problem list updated Consent signed. GBS sent TDAP offered  Rhogam given for RH negative Pediatrician: discussed. Infant feeding: plans to breastfeed. Maternity leave: discussed. Cigarette smoking: never smoked. Orders Placed This Encounter  Procedures  . POCT urinalysis dipstick   No orders of the defined types were placed in this encounter.   Follow up in 2 Weeks.

## 2015-09-28 ENCOUNTER — Other Ambulatory Visit: Payer: Self-pay | Admitting: Certified Nurse Midwife

## 2015-10-04 ENCOUNTER — Ambulatory Visit (INDEPENDENT_AMBULATORY_CARE_PROVIDER_SITE_OTHER): Payer: 59 | Admitting: Obstetrics

## 2015-10-04 ENCOUNTER — Encounter: Payer: 59 | Admitting: Obstetrics

## 2015-10-04 VITALS — BP 102/70 | HR 88 | Temp 99.1°F | Wt 108.0 lb

## 2015-10-04 DIAGNOSIS — Z3483 Encounter for supervision of other normal pregnancy, third trimester: Secondary | ICD-10-CM

## 2015-10-04 DIAGNOSIS — J069 Acute upper respiratory infection, unspecified: Secondary | ICD-10-CM

## 2015-10-04 LAB — POCT URINALYSIS DIPSTICK
Bilirubin, UA: NEGATIVE
GLUCOSE UA: NEGATIVE
Leukocytes, UA: NEGATIVE
NITRITE UA: NEGATIVE
PH UA: 5
PROTEIN UA: NEGATIVE
RBC UA: NEGATIVE
Spec Grav, UA: <=1.005
UROBILINOGEN UA: NEGATIVE

## 2015-10-04 MED ORDER — CEFUROXIME AXETIL 500 MG PO TABS: 500.0000 mg | ORAL_TABLET | Freq: Two times a day (BID) | ORAL | Status: DC

## 2015-10-04 NOTE — Progress Notes (Signed)
Pt states that she has had a head/sinus cold x 1 week.  Pt has tried Netti pot and Rhinocort. Pt states that she has had yellowish-green drainage and cannot seem to clear. Pt is also having trouble sleeping.

## 2015-10-05 ENCOUNTER — Encounter: Payer: Self-pay | Admitting: Obstetrics

## 2015-10-05 NOTE — Progress Notes (Addendum)
Subjective:    Alison Greer is a 39 y.o. female being seen today for her obstetrical visit. She is at 2570w6d gestation. Patient reports congestion and runny nose. Fetal movement: normal.  Problem List Items Addressed This Visit    None    Visit Diagnoses    Encounter for supervision of other normal pregnancy in third trimester    -  Primary    Relevant Orders    POCT urinalysis dipstick (Completed)    URI (upper respiratory infection)        Relevant Medications    cefUROXime (CEFTIN) 500 MG tablet      Patient Active Problem List   Diagnosis Date Noted  . Advanced maternal age in multigravida   . Underweight   . [redacted] weeks gestation of pregnancy    Objective:    BP 102/70 mmHg  Pulse 88  Temp(Src) 99.1 F (37.3 C)  Wt 108 lb (48.988 kg)  LMP 12/30/2014 FHT:  150 BPM  Uterine Size: size less than dates  Presentation: unsure     Assessment:    Pregnancy @ 2470w6d weeks   Plan:     labs reviewed, problem list updated Consent signed. GBS sent TDAP offered  Rhogam given for RH negative Pediatrician: discussed. Infant feeding: plans to breastfeed. Maternity leave: discussed. Cigarette smoking: never smoked. Orders Placed This Encounter  Procedures  . POCT urinalysis dipstick   Meds ordered this encounter  Medications  . cefUROXime (CEFTIN) 500 MG tablet    Sig: Take 1 tablet (500 mg total) by mouth 2 (two) times daily with a meal.    Dispense:  14 tablet    Refill:  1   Follow up in 2 Weeks.

## 2015-10-19 ENCOUNTER — Ambulatory Visit (INDEPENDENT_AMBULATORY_CARE_PROVIDER_SITE_OTHER): Payer: 59 | Admitting: Obstetrics

## 2015-10-19 VITALS — BP 109/72 | HR 84 | Temp 98.2°F | Wt 112.0 lb

## 2015-10-19 DIAGNOSIS — K219 Gastro-esophageal reflux disease without esophagitis: Secondary | ICD-10-CM

## 2015-10-19 DIAGNOSIS — R5383 Other fatigue: Secondary | ICD-10-CM

## 2015-10-19 DIAGNOSIS — Z3483 Encounter for supervision of other normal pregnancy, third trimester: Secondary | ICD-10-CM

## 2015-10-19 LAB — CBC
HCT: 37.7 % (ref 36.0–46.0)
HEMOGLOBIN: 12.8 g/dL (ref 12.0–15.0)
MCH: 31.8 pg (ref 26.0–34.0)
MCHC: 34 g/dL (ref 30.0–36.0)
MCV: 93.5 fL (ref 78.0–100.0)
MPV: 11 fL (ref 8.6–12.4)
Platelets: 130 10*3/uL — ABNORMAL LOW (ref 150–400)
RBC: 4.03 MIL/uL (ref 3.87–5.11)
RDW: 13.2 % (ref 11.5–15.5)
WBC: 5.4 10*3/uL (ref 4.0–10.5)

## 2015-10-19 LAB — COMPREHENSIVE METABOLIC PANEL
ALBUMIN: 3.4 g/dL — AB (ref 3.6–5.1)
ALT: 17 U/L (ref 6–29)
AST: 23 U/L (ref 10–30)
Alkaline Phosphatase: 78 U/L (ref 33–115)
BUN: 4 mg/dL — ABNORMAL LOW (ref 7–25)
CHLORIDE: 105 mmol/L (ref 98–110)
CO2: 17 mmol/L — AB (ref 20–31)
Calcium: 8.4 mg/dL — ABNORMAL LOW (ref 8.6–10.2)
Creat: 0.55 mg/dL (ref 0.50–1.10)
Glucose, Bld: 73 mg/dL (ref 65–99)
POTASSIUM: 3.8 mmol/L (ref 3.5–5.3)
Sodium: 135 mmol/L (ref 135–146)
TOTAL PROTEIN: 5.5 g/dL — AB (ref 6.1–8.1)
Total Bilirubin: 0.4 mg/dL (ref 0.2–1.2)

## 2015-10-19 LAB — POCT URINALYSIS DIPSTICK
BILIRUBIN UA: NEGATIVE
Blood, UA: NEGATIVE
Glucose, UA: NEGATIVE
LEUKOCYTES UA: NEGATIVE
NITRITE UA: NEGATIVE
PH UA: 5.5
PROTEIN UA: NEGATIVE
Spec Grav, UA: 1.01
Urobilinogen, UA: NEGATIVE

## 2015-10-19 MED ORDER — RANITIDINE HCL 150 MG PO TABS: 150.0000 mg | ORAL_TABLET | Freq: Two times a day (BID) | ORAL | Status: DC

## 2015-10-19 NOTE — Progress Notes (Signed)
Pt is having heartburn at night.  Pt also having increase in ctx at night. Pt would like to have iron check, states that she is having little energy.

## 2015-10-20 ENCOUNTER — Encounter: Payer: Self-pay | Admitting: Obstetrics

## 2015-10-20 LAB — TSH: TSH: 3.214 u[IU]/mL (ref 0.350–4.500)

## 2015-10-20 NOTE — Progress Notes (Signed)
Subjective:    Alison Greer is a 39 y.o. female being seen today for her obstetrical visit. She is at 331w0d gestation. Patient reports no complaints. Fetal movement: normal.  Problem List Items Addressed This Visit    None    Visit Diagnoses    Encounter for supervision of other normal pregnancy in third trimester    -  Primary    Relevant Orders    POCT urinalysis dipstick (Completed)    GERD without esophagitis        Relevant Medications    ranitidine (ZANTAC) 150 MG tablet    Other fatigue        Relevant Orders    Comprehensive metabolic panel    TSH    CBC      Patient Active Problem List   Diagnosis Date Noted  . Advanced maternal age in multigravida   . Underweight   . [redacted] weeks gestation of pregnancy    Objective:    BP 109/72 mmHg  Pulse 84  Temp(Src) 98.2 F (36.8 C)  Wt 112 lb (50.803 kg)  LMP 12/30/2014 FHT:  150 BPM  Uterine Size: size less than dates  Presentation: unsure     Assessment:    Pregnancy @ 441w0d weeks   Plan:     labs reviewed, problem list updated Consent signed. GBS sent TDAP offered  Rhogam given for RH negative Pediatrician: discussed. Infant feeding: plans to breastfeed. Maternity leave: discussed. Cigarette smoking: never smoked. Orders Placed This Encounter  Procedures  . Comprehensive metabolic panel  . TSH  . CBC  . POCT urinalysis dipstick   Meds ordered this encounter  Medications  . ranitidine (ZANTAC) 150 MG tablet    Sig: Take 1 tablet (150 mg total) by mouth 2 (two) times daily.    Dispense:  60 tablet    Refill:  5   Follow up in 1 Week.

## 2015-10-22 ENCOUNTER — Encounter (HOSPITAL_COMMUNITY): Payer: Self-pay

## 2015-10-22 ENCOUNTER — Ambulatory Visit (HOSPITAL_COMMUNITY)
Admission: RE | Admit: 2015-10-22 | Discharge: 2015-10-22 | Disposition: A | Discharge status: Home / Self Care | Payer: 59 | Source: Ambulatory Visit | Attending: Obstetrics | Admitting: Obstetrics

## 2015-10-22 ENCOUNTER — Other Ambulatory Visit (HOSPITAL_COMMUNITY): Payer: Self-pay | Admitting: Maternal and Fetal Medicine

## 2015-10-22 DIAGNOSIS — O2613 Low weight gain in pregnancy, third trimester: Secondary | ICD-10-CM

## 2015-10-22 DIAGNOSIS — Z3A35 35 weeks gestation of pregnancy: Secondary | ICD-10-CM

## 2015-10-22 DIAGNOSIS — O09523 Supervision of elderly multigravida, third trimester: Secondary | ICD-10-CM | POA: Diagnosis not present

## 2015-10-22 DIAGNOSIS — O24410 Gestational diabetes mellitus in pregnancy, diet controlled: Secondary | ICD-10-CM

## 2015-10-26 ENCOUNTER — Encounter: Payer: Self-pay | Admitting: Obstetrics

## 2015-10-26 ENCOUNTER — Encounter: Payer: 59 | Admitting: Obstetrics

## 2015-10-26 ENCOUNTER — Ambulatory Visit (INDEPENDENT_AMBULATORY_CARE_PROVIDER_SITE_OTHER): Payer: 59 | Admitting: Obstetrics

## 2015-10-26 VITALS — BP 116/84 | HR 90 | Temp 99.4°F | Wt 115.4 lb

## 2015-10-26 DIAGNOSIS — Z3493 Encounter for supervision of normal pregnancy, unspecified, third trimester: Secondary | ICD-10-CM

## 2015-10-26 DIAGNOSIS — O09521 Supervision of elderly multigravida, first trimester: Secondary | ICD-10-CM

## 2015-10-26 DIAGNOSIS — O24419 Gestational diabetes mellitus in pregnancy, unspecified control: Secondary | ICD-10-CM

## 2015-10-26 DIAGNOSIS — O0993 Supervision of high risk pregnancy, unspecified, third trimester: Secondary | ICD-10-CM

## 2015-10-26 LAB — POCT URINALYSIS DIPSTICK
BILIRUBIN UA: NEGATIVE
Glucose, UA: NEGATIVE
Leukocytes, UA: NEGATIVE
Nitrite, UA: NEGATIVE
PH UA: 5
Protein, UA: NEGATIVE
RBC UA: NEGATIVE
Spec Grav, UA: 1.015
Urobilinogen, UA: NEGATIVE

## 2015-10-26 NOTE — Progress Notes (Signed)
Subjective:    Alison Greer is a 39 y.o. female being seen today for her obstetrical visit. She is at 6448w6d gestation. Patient reports no complaints. Fetal movement: normal.  Problem List Items Addressed This Visit    None    Visit Diagnoses    Prenatal care, third trimester    -  Primary    Relevant Orders    POCT urinalysis dipstick (Completed)      Patient Active Problem List   Diagnosis Date Noted  . Advanced maternal age in multigravida   . Underweight   . [redacted] weeks gestation of pregnancy    Objective:    BP 116/84 mmHg  Pulse 90  Temp(Src) 99.4 F (37.4 C)  Wt 115 lb 6.4 oz (52.345 kg)  LMP 12/30/2014 FHT:  150 BPM  Uterine Size: size less than dates  Presentation: cephalic     Assessment:    Pregnancy @ 6448w6d weeks    AMA Gestational DM, Diet Controlled.  Doing well.  Plan:     labs reviewed, problem list updated Consent signed. GBS sent TDAP offered  Rhogam given for RH negative Pediatrician: discussed. Infant feeding: plans to breastfeed. Maternity leave: discussed. Cigarette smoking: never smoked. Orders Placed This Encounter  Procedures  . POCT urinalysis dipstick   No orders of the defined types were placed in this encounter.   Follow up in 1 Week Weekly NST's.

## 2015-10-26 NOTE — Progress Notes (Signed)
Patient is having a increase in GlasgowBraxton Hicks at night.

## 2015-10-31 NOTE — L&D Delivery Note (Signed)
Delivery Note At 7:34 AM a viable female was delivered via Vaginal, Spontaneous Delivery (Presentation: LOA;  ).  APGAR: 9-9 , ; weight: 2515 grams .   Placenta status: Intact, Spontaneous.  Cord: 3 vessels with the following complications: None.  Cord pH: none  Anesthesia: Epidural  Episiotomy:  None Lacerations:  2nd degree Suture Repair: 2.0 chromic Est. Blood Loss (mL): 350   Mom to postpartum.  Baby to Couplet care / Skin to Skin.  HARPER,CHARLES A 11/17/2015, 8:11 AM

## 2015-11-02 ENCOUNTER — Encounter: Payer: Self-pay | Admitting: Obstetrics

## 2015-11-02 ENCOUNTER — Ambulatory Visit (INDEPENDENT_AMBULATORY_CARE_PROVIDER_SITE_OTHER): Payer: Medicaid Other | Admitting: Obstetrics

## 2015-11-02 VITALS — BP 103/70 | HR 90 | Temp 98.5°F | Wt 112.0 lb

## 2015-11-02 DIAGNOSIS — O0993 Supervision of high risk pregnancy, unspecified, third trimester: Secondary | ICD-10-CM

## 2015-11-02 NOTE — Progress Notes (Signed)
Subjective:    Alison Greer is a 40 y.o. female being seen today for her obstetrical visit. She is at 6410w6d gestation. Patient reports no complaints. Fetal movement: normal.  Problem List Items Addressed This Visit    None    Visit Diagnoses    Supervision of high risk pregnancy in third trimester    -  Primary    Relevant Orders    POCT urinalysis dipstick      Patient Active Problem List   Diagnosis Date Noted  . Advanced maternal age in multigravida   . Underweight   . [redacted] weeks gestation of pregnancy    Objective:    BP 103/70 mmHg  Pulse 90  Temp(Src) 98.5 F (36.9 C)  Wt 112 lb (50.803 kg)  LMP 12/30/2014 FHT:  150 BPM  Uterine Size: size less than dates  Presentation: unsure     Assessment:    Pregnancy @ 1010w6d weeks   Plan:     labs reviewed, problem list updated Consent signed. GBS sent TDAP offered  Rhogam given for RH negative Pediatrician: discussed. Infant feeding: plans to breastfeed. Maternity leave: discussed. Cigarette smoking: never smoked. Orders Placed This Encounter  Procedures  . POCT urinalysis dipstick   No orders of the defined types were placed in this encounter.   Follow up in 1 Week.

## 2015-11-09 ENCOUNTER — Encounter: Payer: Self-pay | Admitting: Obstetrics

## 2015-11-09 ENCOUNTER — Ambulatory Visit (INDEPENDENT_AMBULATORY_CARE_PROVIDER_SITE_OTHER): Payer: Medicaid Other | Admitting: Obstetrics

## 2015-11-09 VITALS — BP 103/69 | HR 81 | Wt 108.0 lb

## 2015-11-09 DIAGNOSIS — Z3483 Encounter for supervision of other normal pregnancy, third trimester: Secondary | ICD-10-CM

## 2015-11-09 LAB — POCT URINALYSIS DIPSTICK
Bilirubin, UA: NEGATIVE
Glucose, UA: NEGATIVE
Leukocytes, UA: NEGATIVE
Nitrite, UA: NEGATIVE
PH UA: 6
PROTEIN UA: NEGATIVE
RBC UA: NEGATIVE
SPEC GRAV UA: 1.015
UROBILINOGEN UA: NEGATIVE

## 2015-11-09 NOTE — Progress Notes (Signed)
Pt states that she is having irregular ctx. No leaking or bleeding.  Pt is concerned as to when to go to hospital, she doesn't want to be to early or too late

## 2015-11-09 NOTE — Progress Notes (Signed)
Subjective:    Alison Greer is a 40 y.o. female being seen today for her obstetrical visit. She is at 9071w6d gestation. Patient reports no complaints. Fetal movement: normal.  Problem List Items Addressed This Visit    None    Visit Diagnoses    Encounter for supervision of other normal pregnancy in third trimester    -  Primary    Relevant Orders    POCT urinalysis dipstick (Completed)      Patient Active Problem List   Diagnosis Date Noted  . Advanced maternal age in multigravida   . Underweight   . [redacted] weeks gestation of pregnancy     Objective:    BP 103/69 mmHg  Pulse 81  Wt 108 lb (48.988 kg)  LMP 12/30/2014 FHT: 150 BPM  Uterine Size: size less than dates  Presentations: unsure   NST:  Reactive.  Irregular UC's, mild  Assessment:    Pregnancy @ 2571w6d weeks    Plan:   Plans for delivery: Vaginal anticipated; labs reviewed; problem list updated Counseling: Consent signed. Infant feeding: plans to breastfeed. Cigarette smoking: never smoked. L&D discussion: symptoms of labor, discussed when to call, discussed what number to call, anesthetic/analgesic options reviewed and delivering clinician:  plans no preference. Postpartum supports and preparation: circumcision discussed and contraception plans discussed.  Follow up in 1 Week.

## 2015-11-14 ENCOUNTER — Encounter (HOSPITAL_COMMUNITY): Payer: Self-pay | Admitting: *Deleted

## 2015-11-14 ENCOUNTER — Inpatient Hospital Stay (HOSPITAL_COMMUNITY)
Admission: AD | Admit: 2015-11-14 | Discharge: 2015-11-14 | Disposition: A | Discharge status: Home / Self Care | Payer: Medicaid Other | Source: Ambulatory Visit | Attending: Obstetrics | Admitting: Obstetrics

## 2015-11-14 DIAGNOSIS — O24419 Gestational diabetes mellitus in pregnancy, unspecified control: Secondary | ICD-10-CM | POA: Insufficient documentation

## 2015-11-14 DIAGNOSIS — N898 Other specified noninflammatory disorders of vagina: Secondary | ICD-10-CM | POA: Diagnosis present

## 2015-11-14 DIAGNOSIS — Z3A38 38 weeks gestation of pregnancy: Secondary | ICD-10-CM | POA: Insufficient documentation

## 2015-11-14 DIAGNOSIS — O26893 Other specified pregnancy related conditions, third trimester: Secondary | ICD-10-CM | POA: Diagnosis not present

## 2015-11-14 LAB — POCT FERN TEST: POCT Fern Test: NEGATIVE

## 2015-11-14 LAB — AMNISURE RUPTURE OF MEMBRANE (ROM) NOT AT ARMC: Amnisure ROM: NEGATIVE

## 2015-11-14 NOTE — Discharge Instructions (Signed)
Braxton Hicks Contractions °Contractions of the uterus can occur throughout pregnancy. Contractions are not always a sign that you are in labor.  °WHAT ARE BRAXTON HICKS CONTRACTIONS?  °Contractions that occur before labor are called Braxton Hicks contractions, or false labor. Toward the end of pregnancy (32-34 weeks), these contractions can develop more often and may become more forceful. This is not true labor because these contractions do not result in opening (dilatation) and thinning of the cervix. They are sometimes difficult to tell apart from true labor because these contractions can be forceful and people have different pain tolerances. You should not feel embarrassed if you go to the hospital with false labor. Sometimes, the only way to tell if you are in true labor is for your health care provider to look for changes in the cervix. °If there are no prenatal problems or other health problems associated with the pregnancy, it is completely safe to be sent home with false labor and await the onset of true labor. °HOW CAN YOU TELL THE DIFFERENCE BETWEEN TRUE AND FALSE LABOR? °False Labor °· The contractions of false labor are usually shorter and not as hard as those of true labor.   °· The contractions are usually irregular.   °· The contractions are often felt in the front of the lower abdomen and in the groin.   °· The contractions may go away when you walk around or change positions while lying down.   °· The contractions get weaker and are shorter lasting as time goes on.   °· The contractions do not usually become progressively stronger, regular, and closer together as with true labor.   °True Labor °1. Contractions in true labor last 30-70 seconds, become very regular, usually become more intense, and increase in frequency.   °2. The contractions do not go away with walking.   °3. The discomfort is usually felt in the top of the uterus and spreads to the lower abdomen and low back.   °4. True labor can  be determined by your health care provider with an exam. This will show that the cervix is dilating and getting thinner.   °WHAT TO REMEMBER °· Keep up with your usual exercises and follow other instructions given by your health care provider.   °· Take medicines as directed by your health care provider.   °· Keep your regular prenatal appointments.   °· Eat and drink lightly if you think you are going into labor.   °· If Braxton Hicks contractions are making you uncomfortable:   °· Change your position from lying down or resting to walking, or from walking to resting.   °· Sit and rest in a tub of warm water.   °· Drink 2-3 glasses of water. Dehydration may cause these contractions.   °· Do slow and deep breathing several times an hour.   °WHEN SHOULD I SEEK IMMEDIATE MEDICAL CARE? °Seek immediate medical care if: °· Your contractions become stronger, more regular, and closer together.   °· You have fluid leaking or gushing from your vagina.   °· You have a fever.   °· You pass blood-tinged mucus.   °· You have vaginal bleeding.   °· You have continuous abdominal pain.   °· You have low back pain that you never had before.   °· You feel your baby's head pushing down and causing pelvic pressure.   °· Your baby is not moving as much as it used to.   °  °This information is not intended to replace advice given to you by your health care provider. Make sure you discuss any questions you have with your health care   provider. °  °Document Released: 10/16/2005 Document Revised: 10/21/2013 Document Reviewed: 07/28/2013 °Elsevier Interactive Patient Education ©2016 [employer] ° °Fetal Movement Counts °Patient Name: __________________________________________________ Patient Due Date: ____________________ °Performing a fetal movement count is highly recommended in high-risk pregnancies, but it is good for every pregnant woman to do. Your health care provider may ask you to start counting fetal movements at 28 weeks of the  pregnancy. Fetal movements often increase: °· After eating a full meal. °· After physical activity. °· After eating or drinking something sweet or cold. °· At rest. °Pay attention to when you feel the baby is most active. This will help you notice a pattern of your baby's sleep and wake cycles and what factors contribute to an increase in fetal movement. It is important to perform a fetal movement count at the same time each day when your baby is normally most active.  °HOW TO COUNT FETAL MOVEMENTS °5. Find a quiet and comfortable area to sit or lie down on your left side. Lying on your left side provides the best blood and oxygen circulation to your baby. °6. Write down the day and time on a sheet of paper or in a journal. °7. Start counting kicks, flutters, swishes, rolls, or jabs in a 2-hour period. You should feel at least 10 movements within 2 hours. °8. If you do not feel 10 movements in 2 hours, wait 2-3 hours and count again. Look for a change in the pattern or not enough counts in 2 hours. °SEEK MEDICAL CARE IF: °· You feel less than 10 counts in 2 hours, tried twice. °· There is no movement in over an hour. °· The pattern is changing or taking longer each day to reach 10 counts in 2 hours. °· You feel the baby is not moving as he or she usually does. °Date: ____________ Movements: ____________ Start time: ____________ Finish time: ____________  °Date: ____________ Movements: ____________ Start time: ____________ Finish time: ____________ °Date: ____________ Movements: ____________ Start time: ____________ Finish time: ____________ °Date: ____________ Movements: ____________ Start time: ____________ Finish time: ____________ °Date: ____________ Movements: ____________ Start time: ____________ Finish time: ____________ °Date: ____________ Movements: ____________ Start time: ____________ Finish time: ____________ °Date: ____________ Movements: ____________ Start time: ____________ Finish time:  ____________ °Date: ____________ Movements: ____________ Start time: ____________ Finish time: ____________  °Date: ____________ Movements: ____________ Start time: ____________ Finish time: ____________ °Date: ____________ Movements: ____________ Start time: ____________ Finish time: ____________ °Date: ____________ Movements: ____________ Start time: ____________ Finish time: ____________ °Date: ____________ Movements: ____________ Start time: ____________ Finish time: ____________ °Date: ____________ Movements: ____________ Start time: ____________ Finish time: ____________ °Date: ____________ Movements: ____________ Start time: ____________ Finish time: ____________ °Date: ____________ Movements: ____________ Start time: ____________ Finish time: ____________  °Date: ____________ Movements: ____________ Start time: ____________ Finish time: ____________ °Date: ____________ Movements: ____________ Start time: ____________ Finish time: ____________ °Date: ____________ Movements: ____________ Start time: ____________ Finish time: ____________ °Date: ____________ Movements: ____________ Start time: ____________ Finish time: ____________ °Date: ____________ Movements: ____________ Start time: ____________ Finish time: ____________ °Date: ____________ Movements: ____________ Start time: ____________ Finish time: ____________ °Date: ____________ Movements: ____________ Start time: ____________ Finish time: ____________  °Date: ____________ Movements: ____________ Start time: ____________ Finish time: ____________ °Date: ____________ Movements: ____________ Start time: ____________ Finish time: ____________ °Date: ____________ Movements: ____________ Start time: ____________ Finish time: ____________ °Date: ____________ Movements: ____________ Start time: ____________ Finish time: ____________ °Date: ____________ Movements: ____________ Start time: ____________ Finish time: ____________ °Date: ____________ Movements:  ____________ Start time: ____________ Finish   time: ____________ °Date: ____________ Movements: ____________ Start time: ____________ Finish time: ____________  °Date: ____________ Movements: ____________ Start time: ____________ Finish time: ____________ °Date: ____________ Movements: ____________ Start time: ____________ Finish time: ____________ °Date: ____________ Movements: ____________ Start time: ____________ Finish time: ____________ °Date: ____________ Movements: ____________ Start time: ____________ Finish time: ____________ °Date: ____________ Movements: ____________ Start time: ____________ Finish time: ____________ °Date: ____________ Movements: ____________ Start time: ____________ Finish time: ____________ °Date: ____________ Movements: ____________ Start time: ____________ Finish time: ____________  °Date: ____________ Movements: ____________ Start time: ____________ Finish time: ____________ °Date: ____________ Movements: ____________ Start time: ____________ Finish time: ____________ °Date: ____________ Movements: ____________ Start time: ____________ Finish time: ____________ °Date: ____________ Movements: ____________ Start time: ____________ Finish time: ____________ °Date: ____________ Movements: ____________ Start time: ____________ Finish time: ____________ °Date: ____________ Movements: ____________ Start time: ____________ Finish time: ____________ °Date: ____________ Movements: ____________ Start time: ____________ Finish time: ____________  °Date: ____________ Movements: ____________ Start time: ____________ Finish time: ____________ °Date: ____________ Movements: ____________ Start time: ____________ Finish time: ____________ °Date: ____________ Movements: ____________ Start time: ____________ Finish time: ____________ °Date: ____________ Movements: ____________ Start time: ____________ Finish time: ____________ °Date: ____________ Movements: ____________ Start time: ____________ Finish  time: ____________ °Date: ____________ Movements: ____________ Start time: ____________ Finish time: ____________ °Date: ____________ Movements: ____________ Start time: ____________ Finish time: ____________  °Date: ____________ Movements: ____________ Start time: ____________ Finish time: ____________ °Date: ____________ Movements: ____________ Start time: ____________ Finish time: ____________ °Date: ____________ Movements: ____________ Start time: ____________ Finish time: ____________ °Date: ____________ Movements: ____________ Start time: ____________ Finish time: ____________ °Date: ____________ Movements: ____________ Start time: ____________ Finish time: ____________ °Date: ____________ Movements: ____________ Start time: ____________ Finish time: ____________ °  °This information is not intended to replace advice given to you by your health care provider. Make sure you discuss any questions you have with your health care provider. °  °Document Released: 11/15/2006 Document Revised: 11/06/2014 Document Reviewed: 08/12/2012 °Elsevier Interactive Patient Education ©2016 [employer] ° °

## 2015-11-14 NOTE — MAU Provider Note (Signed)
Chief Complaint:  Vaginal Discharge  First Provider Initiated Contact with Patient 11/14/15 1444     HPI: Alison Greer is a 40 y.o. G3P2002 at 59w4dwho presents to maternity admissions reporting LOF x 1 while lying in bed. Made end of shirt wet. Mild UC's. No VB. No further leaking. A1GDM.   Associated signs and symptoms: Neg for fever, chills, VB, abd pain. Good fetal movement.   Past Medical History: Past Medical History  Diagnosis Date  . Anemia     Past obstetric history: OB History  Gravida Para Term Preterm AB SAB TAB Ectopic Multiple Living  _0 # Outcome Date GA Lbr Len/2nd Weight Sex Delivery Anes PTL Lv  3 Current           2 Term 05/13/10 337w0d6 lb 4 oz (2.835 kg) M Vag-Spont EPI N Y     Complications: Umbilical cord complication during labor and delivery  1 Term 06/26/05 3728w0d lb 3 oz (2.807 kg) M Vag-Vacuum EPI N Y      Past Surgical History: Past Surgical History  Procedure Laterality Date  . Breast surgery    . Wisdom tooth extraction       Family History: History reviewed. No pertinent family history.  Social History: Social History  Substance Use Topics  . Smoking status: Never Smoker   . Smokeless tobacco: Never Used  . Alcohol Use: No     Comment: rare    Allergies:  Allergies  Allergen Reactions  . Azithromycin Nausea And Vomiting    Meds:  Prescriptions prior to admission  Medication Sig Dispense Refill Last Dose  . Budesonide (RHINOCORT ALLERGY NA) Place 1 Dose into the nose daily.   11/14/2015 at Unknown time  . mirtazapine (REMERON) 15 MG tablet Take 15 mg by mouth at bedtime. Half of a tablet daily   11/13/2015 at Unknown time  . ranitidine (ZANTAC) 150 MG tablet Take 1 tablet (150 mg total) by mouth 2 (two) times daily. 60 tablet 5 11/13/2015 at Unknown time  . Blood Glucose Monitoring Suppl (ACCU-CHEK AVIVA PLUS) W/DEVICE KIT 1 kit by Does not apply route once. Patient has gestational diabetes and will be checking  her blood sugar 4 times daily. (Patient not taking: Reported on 11/14/2015) 1 kit 0 Taking  . cefUROXime (CEFTIN) 500 MG tablet Take 1 tablet (500 mg total) by mouth 2 (two) times daily with a meal. (Patient not taking: Reported on 10/22/2015) 14 tablet 1 Not Taking  . glucose blood (ACCU-CHEK AVIVA) test strip Patient to check blood sugar fasting and 2 hours after each meal. (Patient not taking: Reported on 11/14/2015) 100 each 12 Taking  . Lancets (ACCU-CHEK MULTICLIX) lancets Patient to check blood sugar fasting and 2 hours after each meal. (Patient not taking: Reported on 11/14/2015) 100 each 12 Taking  . metroNIDAZOLE (FLAGYL) 500 MG tablet Take 1 tablet (500 mg total) by mouth 2 (two) times daily. (Patient not taking: Reported on 09/17/2015) 14 tablet 2 Not Taking  . Prenat w/o A-FeCbn-Meth-FA-DHA (OB COMPLETE GOLD) 27.5-1-200 MG CAPS Take 1 capsule by mouth daily before breakfast. (Patient not taking: Reported on 11/14/2015) 90 capsule 3 Taking  . terconazole (TERAZOL 7) 0.4 % vaginal cream Place 1 applicator vaginally at bedtime. (Patient not taking: Reported on 10/22/2015) 45 g 4 Not Taking    I have reviewed patient's Past Medical Hx, Surgical Hx, Family Hx, Social Hx, medications  and allergies.   ROS:  Review of Systems  Constitutional: Negative for fever and chills.  Gastrointestinal: Negative for abdominal pain.  Genitourinary: Negative for vaginal bleeding.       Pos LOF    Physical Exam   Patient Vitals for the past 24 hrs:  BP Temp Temp src Pulse Resp Height Weight  11/14/15 1359 97/64 mmHg 97.6 F (36.4 C) Oral 77 16 5' 7.25" (1.708 m) 112 lb 6.4 oz (50.984 kg)   Constitutional: Well-developed, well-nourished female in no acute distress.  Cardiovascular: normal rate Respiratory: normal effort GI: Abd soft, non-tender, gravid S<D. Pos FM.   MS: Extremities nontender, no edema, normal ROM Neurologic: Alert and oriented x 4.  GU: Neg CVAT.  Pelvic: NEFG, small amount of  mucoid discharge, neg pooling, no blood, cervix clean.    Cervix long and closed  FHT:  Baseline 140 , moderate variability, accelerations present, no decelerations Contractions: irreg, mild   Labs: Results for orders placed or performed during the hospital encounter of 11/14/15 (from the past 24 hour(s))  Amnisure rupture of membrane (rom)not at [facility]     Status: None   Collection Time: 11/14/15  3:20 PM  Result Value Ref Range   Amnisure ROM NEGATIVE   Fern Test     Status: None   Collection Time: 11/14/15  4:01 PM  Result Value Ref Range   POCT Fern Test Negative = intact amniotic membranes     Imaging:  NA  MAU Course: SSE, fern  MDM: 40 year-old female w/ report of LOF x 1, but no evidence of ROM on exam.   Assessment: 1. Vaginal discharge during pregnancy in third trimester     Plan: Discharge home in stable condition.  Labor precautions and fetal kick counts     Follow-up Information    Follow up with HARPER,CHARLES A, MD.   Specialty:  Obstetrics and Gynecology   Why:  keep scheduled appointment   Contact information:   [address] [place] [address] [phone]         Medication List    STOP taking these medications        ACCU-CHEK AVIVA PLUS w/Device Kit     accu-chek multiclix lancets     cefUROXime 500 MG tablet  Commonly known as:  CEFTIN     glucose blood test strip  Commonly known as:  ACCU-CHEK AVIVA     metroNIDAZOLE 500 MG tablet  Commonly known as:  FLAGYL     OB COMPLETE GOLD 27.5-1-200 MG Caps     terconazole 0.4 % vaginal cream  Commonly known as:  TERAZOL 7      TAKE these medications        mirtazapine 15 MG tablet  Commonly known as:  REMERON  Take 15 mg by mouth at bedtime. Half of a tablet daily     ranitidine 150 MG tablet  Commonly known as:  ZANTAC  Take 1 tablet (150 mg total) by mouth 2 (two) times daily.     RHINOCORT ALLERGY NA  Place 1 Dose into the nose daily.         [place], [place] 11/14/2015 4:02 PM

## 2015-11-14 NOTE — MAU Note (Signed)
Was laying in bed felt some water come out was clear fluid, has not felt since, irregular contractions for days, pain 0/10, diagnosed with gestational diabetes diet controlled.

## 2015-11-15 ENCOUNTER — Encounter: Payer: Self-pay | Admitting: Obstetrics

## 2015-11-15 ENCOUNTER — Ambulatory Visit (INDEPENDENT_AMBULATORY_CARE_PROVIDER_SITE_OTHER): Payer: Medicaid Other | Admitting: Obstetrics

## 2015-11-15 VITALS — BP 97/73 | HR 77 | Temp 98.6°F | Wt 113.0 lb

## 2015-11-15 DIAGNOSIS — O0993 Supervision of high risk pregnancy, unspecified, third trimester: Secondary | ICD-10-CM

## 2015-11-15 NOTE — Progress Notes (Signed)
Subjective:    Alison Greer is a 40 y.o. female being seen today for her obstetrical visit. She is at 2644w5d gestation. Patient reports occasional contractions. Fetal movement: normal.  Problem List Items Addressed This Visit    None    Visit Diagnoses    Supervision of high risk pregnancy in third trimester    -  Primary    Relevant Orders    POCT urinalysis dipstick    Strep B DNA probe      Patient Active Problem List   Diagnosis Date Noted  . Advanced maternal age in multigravida   . Underweight   . [redacted] weeks gestation of pregnancy     Objective:    BP 97/73 mmHg  Pulse 77  Temp(Src) 98.6 F (37 C)  Wt 113 lb (51.256 kg)  LMP 12/30/2014 FHT: 150 BPM  Uterine Size: size less than dates  Presentations: cephalic    Assessment:    Pregnancy @ 7344w5d weeks   Plan:   Plans for delivery: Vaginal anticipated; labs reviewed; problem list updated Counseling: Consent signed. Infant feeding: plans to breastfeed. Cigarette smoking: never smoked. L&D discussion: symptoms of labor, discussed when to call, discussed what number to call, anesthetic/analgesic options reviewed and delivering clinician:  plans no preference. Postpartum supports and preparation: circumcision discussed and contraception plans discussed.  IOL 11-17-15, elective.

## 2015-11-16 ENCOUNTER — Other Ambulatory Visit: Payer: Self-pay | Admitting: *Deleted

## 2015-11-16 ENCOUNTER — Telehealth (HOSPITAL_COMMUNITY): Payer: Self-pay | Admitting: *Deleted

## 2015-11-16 LAB — STREP B DNA PROBE: STREP GROUP B AG: NOT DETECTED

## 2015-11-16 NOTE — Telephone Encounter (Signed)
Preadmission screen  

## 2015-11-17 ENCOUNTER — Inpatient Hospital Stay (HOSPITAL_COMMUNITY)
Admission: RE | Admit: 2015-11-17 | Discharge: 2015-11-18 | DRG: 775 | Disposition: A | Discharge status: Home / Self Care | Payer: Medicaid Other | Source: Ambulatory Visit | Attending: Obstetrics | Admitting: Obstetrics

## 2015-11-17 ENCOUNTER — Inpatient Hospital Stay (HOSPITAL_COMMUNITY): Payer: Medicaid Other | Admitting: Anesthesiology

## 2015-11-17 ENCOUNTER — Encounter (HOSPITAL_COMMUNITY): Payer: Self-pay

## 2015-11-17 DIAGNOSIS — Z3A39 39 weeks gestation of pregnancy: Secondary | ICD-10-CM

## 2015-11-17 DIAGNOSIS — O09529 Supervision of elderly multigravida, unspecified trimester: Secondary | ICD-10-CM

## 2015-11-17 DIAGNOSIS — O24420 Gestational diabetes mellitus in childbirth, diet controlled: Principal | ICD-10-CM | POA: Diagnosis present

## 2015-11-17 LAB — CBC
HCT: 35 % — ABNORMAL LOW (ref 36.0–46.0)
HEMATOCRIT: 32.6 % — AB (ref 36.0–46.0)
HEMOGLOBIN: 12 g/dL (ref 12.0–15.0)
Hemoglobin: 11.2 g/dL — ABNORMAL LOW (ref 12.0–15.0)
MCH: 31.7 pg (ref 26.0–34.0)
MCH: 32 pg (ref 26.0–34.0)
MCHC: 34.3 g/dL (ref 30.0–36.0)
MCHC: 34.4 g/dL (ref 30.0–36.0)
MCV: 92.6 fL (ref 78.0–100.0)
MCV: 93.1 fL (ref 78.0–100.0)
Platelets: 105 10*3/uL — ABNORMAL LOW (ref 150–400)
Platelets: 84 10*3/uL — ABNORMAL LOW (ref 150–400)
RBC: 3.78 MIL/uL — ABNORMAL LOW (ref 3.87–5.11)
RBC: 3.5 MIL/uL — ABNORMAL LOW (ref 3.87–5.11)
RDW: 13.7 % (ref 11.5–15.5)
RDW: 13.6 % (ref 11.5–15.5)
WBC: 5.1 10*3/uL (ref 4.0–10.5)
WBC: 6.2 10*3/uL (ref 4.0–10.5)

## 2015-11-17 LAB — GLUCOSE, RANDOM: Glucose, Bld: 79 mg/dL (ref 65–99)

## 2015-11-17 LAB — ABO/RH: ABO/RH(D): A NEG

## 2015-11-17 LAB — TYPE AND SCREEN
ABO/RH(D): A NEG
Antibody Screen: NEGATIVE

## 2015-11-17 MED ORDER — IBUPROFEN 600 MG PO TABS
600.0000 mg | ORAL_TABLET | Freq: Four times a day (QID) | ORAL | Status: DC
  Administered 2015-11-17 – 2015-11-18 (×4): 600 mg via ORAL
  Filled 2015-11-17 (×4): qty 1

## 2015-11-17 MED ORDER — FENTANYL 2.5 MCG/ML BUPIVACAINE 1/10 % EPIDURAL INFUSION (WH - ANES)
14.0000 mL/h | INTRAMUSCULAR | Status: DC | PRN
  Administered 2015-11-17: 14 mL/h via EPIDURAL
  Filled 2015-11-17: qty 125

## 2015-11-17 MED ORDER — DIPHENHYDRAMINE HCL 25 MG PO CAPS: 25.0000 mg | ORAL_CAPSULE | Freq: Four times a day (QID) | ORAL | Status: DC | PRN

## 2015-11-17 MED ORDER — LIDOCAINE HCL (PF) 1 % IJ SOLN
INTRAMUSCULAR | Status: DC | PRN
  Administered 2015-11-17: 4 mL
  Administered 2015-11-17: 6 mL via EPIDURAL

## 2015-11-17 MED ORDER — CITRIC ACID-SODIUM CITRATE 334-500 MG/5ML PO SOLN: 30.0000 mL | ORAL | Status: DC | PRN

## 2015-11-17 MED ORDER — ZOLPIDEM TARTRATE 5 MG PO TABS: 5.0000 mg | ORAL_TABLET | Freq: Every evening | ORAL | Status: DC | PRN

## 2015-11-17 MED ORDER — DIPHENHYDRAMINE HCL 50 MG/ML IJ SOLN: 12.5000 mg | INTRAMUSCULAR | Status: DC | PRN

## 2015-11-17 MED ORDER — MISOPROSTOL 25 MCG QUARTER TABLET
25.0000 ug | ORAL_TABLET | ORAL | Status: DC | PRN
  Administered 2015-11-17: 25 ug via VAGINAL
  Filled 2015-11-17: qty 1
  Filled 2015-11-17: qty 0.25

## 2015-11-17 MED ORDER — ONDANSETRON HCL 4 MG PO TABS: 4.0000 mg | ORAL_TABLET | ORAL | Status: DC | PRN

## 2015-11-17 MED ORDER — EPHEDRINE 5 MG/ML INJ
10.0000 mg | INTRAVENOUS | Status: DC | PRN
  Filled 2015-11-17: qty 2

## 2015-11-17 MED ORDER — BENZOCAINE-MENTHOL 20-0.5 % EX AERO: 1.0000 "application " | INHALATION_SPRAY | CUTANEOUS | Status: DC | PRN

## 2015-11-17 MED ORDER — OXYTOCIN 10 UNIT/ML IJ SOLN
2.5000 [IU]/h | INTRAVENOUS | Status: DC
  Administered 2015-11-17: 999 m[IU]/min via INTRAVENOUS
  Filled 2015-11-17: qty 4

## 2015-11-17 MED ORDER — OXYCODONE-ACETAMINOPHEN 5-325 MG PO TABS: 1.0000 | ORAL_TABLET | ORAL | Status: DC | PRN

## 2015-11-17 MED ORDER — OXYTOCIN BOLUS FROM INFUSION: 500.0000 mL | INTRAVENOUS | Status: DC

## 2015-11-17 MED ORDER — OXYCODONE-ACETAMINOPHEN 5-325 MG PO TABS: 2.0000 | ORAL_TABLET | ORAL | Status: DC | PRN

## 2015-11-17 MED ORDER — DIBUCAINE 1 % RE OINT: 1.0000 "application " | TOPICAL_OINTMENT | RECTAL | Status: DC | PRN

## 2015-11-17 MED ORDER — ONDANSETRON HCL 4 MG/2ML IJ SOLN: 4.0000 mg | INTRAMUSCULAR | Status: DC | PRN

## 2015-11-17 MED ORDER — LANOLIN HYDROUS EX OINT: TOPICAL_OINTMENT | CUTANEOUS | Status: DC | PRN

## 2015-11-17 MED ORDER — LIDOCAINE HCL (PF) 1 % IJ SOLN
30.0000 mL | INTRAMUSCULAR | Status: DC | PRN
  Administered 2015-11-17: 30 mL via SUBCUTANEOUS
  Filled 2015-11-17: qty 30

## 2015-11-17 MED ORDER — OXYTOCIN 10 UNIT/ML IJ SOLN: 2.5000 [IU]/h | INTRAVENOUS | Status: DC | PRN

## 2015-11-17 MED ORDER — PRENATAL MULTIVITAMIN CH: 1.0000 | ORAL_TABLET | Freq: Every day | ORAL | Status: DC

## 2015-11-17 MED ORDER — TERBUTALINE SULFATE 1 MG/ML IJ SOLN
0.2500 mg | Freq: Once | INTRAMUSCULAR | Status: DC | PRN
  Filled 2015-11-17: qty 1

## 2015-11-17 MED ORDER — ACETAMINOPHEN 325 MG PO TABS
650.0000 mg | ORAL_TABLET | ORAL | Status: DC | PRN
  Administered 2015-11-17: 650 mg via ORAL
  Filled 2015-11-17: qty 2

## 2015-11-17 MED ORDER — LACTATED RINGERS IV SOLN: 500.0000 mL | INTRAVENOUS | Status: DC | PRN

## 2015-11-17 MED ORDER — SENNOSIDES-DOCUSATE SODIUM 8.6-50 MG PO TABS
2.0000 | ORAL_TABLET | ORAL | Status: DC
  Administered 2015-11-18: 2 via ORAL
  Filled 2015-11-17: qty 2

## 2015-11-17 MED ORDER — WITCH HAZEL-GLYCERIN EX PADS: 1.0000 "application " | MEDICATED_PAD | CUTANEOUS | Status: DC | PRN

## 2015-11-17 MED ORDER — TETANUS-DIPHTH-ACELL PERTUSSIS 5-2.5-18.5 LF-MCG/0.5 IM SUSP: 0.5000 mL | Freq: Once | INTRAMUSCULAR | Status: DC

## 2015-11-17 MED ORDER — LACTATED RINGERS IV SOLN
INTRAVENOUS | Status: DC
  Administered 2015-11-17: 02:00:00 via INTRAVENOUS

## 2015-11-17 MED ORDER — ONDANSETRON HCL 4 MG/2ML IJ SOLN: 4.0000 mg | Freq: Four times a day (QID) | INTRAMUSCULAR | Status: DC | PRN

## 2015-11-17 MED ORDER — SIMETHICONE 80 MG PO CHEW: 80.0000 mg | CHEWABLE_TABLET | ORAL | Status: DC | PRN

## 2015-11-17 MED ORDER — PHENYLEPHRINE 40 MCG/ML (10ML) SYRINGE FOR IV PUSH (FOR BLOOD PRESSURE SUPPORT)
80.0000 ug | PREFILLED_SYRINGE | INTRAVENOUS | Status: DC | PRN
  Filled 2015-11-17: qty 20
  Filled 2015-11-17: qty 2

## 2015-11-17 MED ORDER — ACETAMINOPHEN 325 MG PO TABS: 650.0000 mg | ORAL_TABLET | ORAL | Status: DC | PRN

## 2015-11-17 NOTE — Anesthesia Procedure Notes (Signed)
Epidural Patient location during procedure: OB  Preanesthetic Checklist Completed: patient identified, site marked, surgical consent, pre-op evaluation, timeout performed, IV checked, risks and benefits discussed and monitors and equipment checked  Epidural Patient position: sitting Prep: site prepped and draped and DuraPrep Patient monitoring: continuous pulse ox and blood pressure Approach: midline Location: L3-L4 Injection technique: LOR air  Needle:  Needle type: Tuohy  Needle gauge: 17 G Needle length: 9 cm and 9 Needle insertion depth: 4 cm Catheter type: closed end flexible Catheter size: 19 Gauge Catheter at skin depth: 10 cm Test dose: negative  Assessment Events: blood not aspirated, injection not painful, no injection resistance, negative IV test and no paresthesia  Additional Notes Dosing of Epidural:  1st dose, through catheter .............................................  Xylocaine 40 mg  2nd dose, through catheter, after waiting 3 minutes.........Xylocaine 60 mg    As each dose occurred, patient was free of IV sx; and patient exhibited no evidence of SA injection.  Patient is more comfortable after epidural dosed. Please see RN's note for documentation of vital signs,and FHR which are stable.  Patient reminded not to try to ambulate with numb legs, and that an RN must be present when she attempts to get up.       

## 2015-11-17 NOTE — H&P (Signed)
Alison Greer is a 40 y.o. female presenting for IOL. Maternal Medical History:  Fetal activity: Perceived fetal activity is normal.   Last perceived fetal movement was within the past hour.    Prenatal Complications - Diabetes: gestational. Diabetes is managed by diet.      OB History    Gravida Para Term Preterm AB TAB SAB Ectopic Multiple Living   Past Medical History  Diagnosis Date  . Anemia    Past Surgical History  Procedure Laterality Date  . Breast surgery    . Wisdom tooth extraction     Family History: family history is not on file. Social History:  reports that she has never smoked. She has never used smokeless tobacco. She reports that she does not drink alcohol or use illicit drugs.   Prenatal Transfer Tool  Maternal Diabetes: Yes:  Diabetes Type:  Diet controlled Genetic Screening: Normal Maternal Ultrasounds/Referrals: Normal Fetal Ultrasounds or other Referrals:  Referred to Materal Fetal Medicine  Maternal Substance Abuse:  No Significant Maternal Medications:  None Significant Maternal Lab Results:  None Other Comments:  None  Review of Systems  All other systems reviewed and are negative.   Dilation: 4 Effacement (%): 90 Station: -1 Exam by:: D Jasso, RN Blood pressure 110/70, pulse 56, temperature 97.8 F (36.6 C), temperature source Axillary, resp. rate 20, height 5' 7.75" (1.721 m), weight 112 lb (50.803 kg), last menstrual period 12/30/2014. Maternal Exam:  Abdomen: Patient reports no abdominal tenderness. Fetal presentation: vertex  Cervix: Cervix evaluated by digital exam.     Physical Exam  Nursing note and vitals reviewed. Constitutional: She is oriented to person, place, and time. She appears well-developed and well-nourished.  HENT:  Head: Normocephalic and atraumatic.  Eyes: Conjunctivae are normal. Pupils are equal, round, and reactive to light.  Neck: Normal range of motion. Neck supple.  Cardiovascular:  Normal rate and regular rhythm.   Respiratory: Effort normal.  GI: Soft.  Genitourinary: Vagina normal and uterus normal.  Musculoskeletal: Normal range of motion.  Neurological: She is alert and oriented to person, place, and time.  Skin: Skin is warm and dry.  Psychiatric: She has a normal mood and affect. Her behavior is normal. Judgment and thought content normal.    Prenatal labs: ABO, Rh: --/--/A NEG (01/18 0145) Antibody: NEG (01/18 0145) Rubella: 17.30 (06/09 1727) RPR: NON REAC (11/02 1003)  HBsAg: NEGATIVE (06/09 1727)  HIV: NONREACTIVE (11/02 1003)  GBS: NOT DETECTED (01/16 1714)   Assessment/Plan: 39 weeks.  Elective IOL.   HARPER,CHARLES A 11/17/2015, 7:05 AM

## 2015-11-17 NOTE — Progress Notes (Signed)
Alison Greer is a 40 y.o. G3P2002 at [redacted]w[redacted]d by LMP admitted for induction of labor due to Elective at term.  Subjective:   Objective: BP 110/70 mmHg  Pulse 56  Temp(Src) 97.8 F (36.6 C) (Axillary)  Resp 20  Ht 5' 7.75" (1.721 m)  Wt 112 lb (50.803 kg)  BMI 17.15 kg/m2  LMP 12/30/2014      FHT:  FHR: 145 bpm, variability: moderate,  accelerations:  Present,  decelerations:  Absent UC:   regular, every 3 minutes SVE:   Dilation: 10 Effacement (%): 90 Station: -1 Exam by:: D Jasso, RN  Labs: Lab Results  Component Value Date   WBC 5.1 11/17/2015   HGB 12.0 11/17/2015   HCT 35.0* 11/17/2015   MCV 92.6 11/17/2015   PLT 105* 11/17/2015    Assessment / Plan: 39 weeks.  AMA.  Gestational Diabetes.  Elective IOL.  Labor: Progressing normally Preeclampsia:  n/a Fetal Wellbeing:  Category I Pain Control:  Epidural I/D:  n/a Anticipated MOD:  NSVD  Jirah Rider A 11/17/2015, 7:22 AM

## 2015-11-17 NOTE — Lactation Note (Signed)
This note was copied from the chart of Alison Greer. Lactation Consultation Note  Patient Name: Alison Greer [id number] Date: 11/17/2015 Reason for consult: Initial assessment Baby at 10 hr of life and mom reports that bf is going well. Her oldest had a hard time latching and she went back to work at 6 wk so she "just stopped". Her middle latched well but she felt like she was pushed into supplementing with him. She stated that if she would have just kept ebf she thinks her supply would have been better. She did bf the middle son until her was 109 months old and "he didn't want it any more". She has had implants placed twice, still there, bf her other children with them. Offered DEBP but she declined at this time. She stated that she would manually express and spoon feed whatever she gets. Given spoon. She reports knowing how to manually express and she has seen colostrum bilaterally. Discussed baby behavior, pumping, feeding frequency, baby belly size, voids, wt loss, breast changes, and nipple care. Given lactation handouts. Aware of OP services and support group. Encouraged mom to call at next feeding so RN or LC could observe a feeding.     Maternal Data    Feeding Feeding Type: Breast Fed  LATCH Score/Interventions Latch: Grasps breast easily, tongue down, lips flanged, rhythmical sucking.  Audible Swallowing: A few with stimulation  Type of Nipple: Everted at rest and after stimulation  Comfort (Breast/Nipple): Soft / non-tender     Hold (Positioning): Assistance needed to correctly position infant at breast and maintain latch.  LATCH Score: 8  Lactation Tools Discussed/Used WIC Program: Yes   Consult Status Consult Status: Follow-up Date: 11/18/15 Follow-up type: In-patient    Rulon Eisenmenger 11/17/2015, 6:22 PM

## 2015-11-17 NOTE — Anesthesia Preprocedure Evaluation (Signed)
Anesthesia Evaluation  Patient identified by MRN, date of birth, ID band Patient awake    Reviewed: Allergy & Precautions, H&P , Patient's Chart, lab work & pertinent test results  Airway Mallampati: II  TM Distance: >3 FB Neck ROM: full    Dental  (+) Teeth Intact   Pulmonary    breath sounds clear to auscultation       Cardiovascular  Rhythm:regular Rate:Normal     Neuro/Psych    GI/Hepatic   Endo/Other    Renal/GU      Musculoskeletal   Abdominal   Peds  Hematology   Anesthesia Other Findings  Low platelets; no PIH/ PEC     Reproductive/Obstetrics (+) Pregnancy                             Anesthesia Physical Anesthesia Plan  ASA: II  Anesthesia Plan: Epidural   Post-op Pain Management:    Induction:   Airway Management Planned:   Additional Equipment:   Intra-op Plan:   Post-operative Plan:   Informed Consent: I have reviewed the patients History and Physical, chart, labs and discussed the procedure including the risks, benefits and alternatives for the proposed anesthesia with the patient or authorized representative who has indicated his/her understanding and acceptance.   Dental Advisory Given  Plan Discussed with:   Anesthesia Plan Comments: (Labs checked- platelets confirmed with RN in room. Fetal heart tracing, per RN, reported to be stable enough for sitting procedure. Discussed epidural, and patient consents to the procedure:  included risk of possible headache,backache, failed block, allergic reaction, and nerve injury. This patient was asked if she had any questions or concerns before the procedure started.)        Anesthesia Quick Evaluation

## 2015-11-17 NOTE — Anesthesia Postprocedure Evaluation (Signed)
Anesthesia Post Note  Patient: Alison Greer  Procedure(s) Performed: * No procedures listed *  Patient location during evaluation: Mother Baby Anesthesia Type: Epidural Level of consciousness: awake Pain management: satisfactory to patient Vital Signs Assessment: post-procedure vital signs reviewed and stable Respiratory status: spontaneous breathing Cardiovascular status: stable Anesthetic complications: no    Last Vitals:  Filed Vitals:   11/17/15 1030 11/17/15 1130  BP: 106/55 110/57  Pulse: 63 64  Temp: 36.7 C 36.7 C  Resp: 18 18    Last Pain:  Filed Vitals:   11/17/15 1241  PainSc: 0-No pain                 Lynae Pederson

## 2015-11-18 LAB — CBC
HCT: 34.8 % — ABNORMAL LOW (ref 36.0–46.0)
Hemoglobin: 11.8 g/dL — ABNORMAL LOW (ref 12.0–15.0)
MCH: 32.2 pg (ref 26.0–34.0)
MCHC: 33.9 g/dL (ref 30.0–36.0)
MCV: 95.1 fL (ref 78.0–100.0)
PLATELETS: 107 10*3/uL — AB (ref 150–400)
RBC: 3.66 MIL/uL — AB (ref 3.87–5.11)
RDW: 13.9 % (ref 11.5–15.5)
WBC: 8.4 10*3/uL (ref 4.0–10.5)

## 2015-11-18 LAB — RPR: RPR Ser Ql: NONREACTIVE

## 2015-11-18 MED ORDER — OXYCODONE-ACETAMINOPHEN 5-325 MG PO TABS: 1.0000 | ORAL_TABLET | ORAL | Status: DC | PRN

## 2015-11-18 MED ORDER — IBUPROFEN 600 MG PO TABS: 600.0000 mg | ORAL_TABLET | Freq: Three times a day (TID) | ORAL | Status: DC

## 2015-11-18 MED ORDER — SENNOSIDES-DOCUSATE SODIUM 8.6-50 MG PO TABS: 2.0000 | ORAL_TABLET | Freq: Every evening | ORAL | Status: DC | PRN

## 2015-11-18 NOTE — Progress Notes (Signed)
Placed phone call to Dr. Hart Rochester in anesthesia, requested order to remove epidural.related that latest platelet resulted in 107,000.  May pull epidural.

## 2015-11-18 NOTE — Discharge Summary (Signed)
Obstetric Discharge Summary Reason for Admission: induction of labor Prenatal Procedures: ultrasound Intrapartum Procedures: spontaneous vaginal delivery Postpartum Procedures: none Complications-Operative and Postpartum: none HEMOGLOBIN  Date Value Ref Range Status  11/18/2015 11.8* 12.0 - 15.0 g/dL Final   HCT  Date Value Ref Range Status  11/18/2015 34.8* 36.0 - 46.0 % Final    Physical Exam:  General: alert, cooperative and no distress Lochia: appropriate Uterine Fundus: firm Incision: none DVT Evaluation: No evidence of DVT seen on physical exam. No cords or calf tenderness. No significant calf/ankle edema.  Discharge Diagnoses: Term Pregnancy-delivered  Discharge Information: Date: 11/18/2015 Activity: pelvic rest Diet: routine Medications: PNV, Ibuprofen, Colace and Percocet Condition: stable Instructions: refer to practice specific booklet Discharge to: home   Newborn Data: Live born female  Birth Weight: 5 lb 8.7 oz (2515 g) APGAR: 9, 9  Home with mother.  Roe Coombs, CNM 11/18/2015, 8:51 AM

## 2015-11-18 NOTE — Progress Notes (Signed)
Post Partum Day #1 Subjective: no complaints, up ad lib, voiding, tolerating PO, + flatus and has had a bowel movement. States breast feeding is going well.  Patient desires early discharge, spouse is having surgery tomorrow.    Objective: Blood pressure 97/60, pulse 76, temperature 97.4 F (36.3 C), temperature source Oral, resp. rate 18, height 5' 7.75" (1.721 m), weight 112 lb (50.803 kg), last menstrual period 12/30/2014, SpO2 100 %, unknown if currently breastfeeding.  Physical Exam:  General: alert, cooperative and no distress Lochia: appropriate Uterine Fundus: firm Incision: none DVT Evaluation: No evidence of DVT seen on physical exam. No cords or calf tenderness. No significant calf/ankle edema.   Recent Labs  11/17/15 0845 11/18/15 0620  HGB 11.2* 11.8*  HCT 32.6* 34.8*    Assessment/Plan: Discharge home and Breastfeeding   LOS: 1 day   Roe Coombs, CNM 11/18/2015, 8:48 AM

## 2015-11-18 NOTE — Progress Notes (Signed)
Pt has NSL and epidural catheter in place. Awaiting AM CBC results. RN to notify anesthesia if plts <100, 000.- for further orders.

## 2015-11-18 NOTE — Lactation Note (Signed)
This note was copied from the chart of Alison Margarete Horace. Lactation Consultation Note  Baby < 6 lbs.  Mother has implants and is concerned baby is only breastfeeding for 15 min and she falls asleep. Discussed SGA feeding behavior and suggest when she gets home she starts to post pump a few times a day. Recommend she give baby back volume pumped.  Suggest she post pump 4-5 times a day for 10-15 min w/ DEBP. Provided mother with hand pump and faxed WIC pump referral. Demonstrated how to cup and syringe feed.  Suggest after 1-2 days depending on infant's weight parents should give breastmilk supplement with slow flow nipple. Reviewed waking techniques and feeding baby STS and burping in between breasts. Also discussed hand expressing into spoon and giving to baby until volume increases.  Referred mother to [email or web address].org website for breast surgery breastfeeding information. Reviewed engorgement care and monitoring voids/stools.   Patient Name: Alison Greer [id number] Date: 11/18/2015 Reason for consult: Follow-up assessment   Maternal Data    Feeding Feeding Type: Breast Fed Length of feed: 15 min  LATCH Score/Interventions                      Lactation Tools Discussed/Used     Consult Status Consult Status: Complete    Hardie Pulley 11/18/2015, 11:35 AM

## 2015-11-22 ENCOUNTER — Telehealth: Payer: Self-pay

## 2015-11-22 DIAGNOSIS — Z029 Encounter for administrative examinations, unspecified: Secondary | ICD-10-CM

## 2015-11-22 NOTE — Telephone Encounter (Signed)
FMLA PAPERS READY - 15.00 CHARGE - LEFT VM WITH PATIENT

## 2015-11-30 ENCOUNTER — Encounter: Payer: Self-pay | Admitting: Obstetrics

## 2015-11-30 ENCOUNTER — Ambulatory Visit (INDEPENDENT_AMBULATORY_CARE_PROVIDER_SITE_OTHER): Payer: Medicaid Other | Admitting: Obstetrics

## 2015-11-30 DIAGNOSIS — Z3009 Encounter for other general counseling and advice on contraception: Secondary | ICD-10-CM

## 2015-11-30 DIAGNOSIS — K5901 Slow transit constipation: Secondary | ICD-10-CM

## 2015-11-30 NOTE — Progress Notes (Signed)
Subjective:     Alison Greer is a 40 y.o. female who presents for a postpartum visit. She is 2 weeks postpartum following a spontaneous vaginal delivery. I have fully reviewed the prenatal and intrapartum course. The delivery was at 39 gestational weeks. Outcome: spontaneous vaginal delivery. Anesthesia: epidural. Postpartum course has been normal except for constipation. Baby's course has been normal. Baby is feeding by bottle - Similac Advance. Bleeding thin lochia. Bowel function is complicated by constipation. Bladder function is normal. Patient is not sexually active. Contraception method is abstinence. Postpartum depression screening: negative.  Tobacco, alcohol and substance abuse history reviewed.  Adult immunizations reviewed including TDAP, rubella and varicella.  The following portions of the patient's history were reviewed and updated as appropriate: allergies, current medications, past family history, past medical history, past social history, past surgical history and problem list.  Review of Systems A comprehensive review of systems was negative.  Except for constipation.  Objective:    LMP 12/30/2014  PE: Deferred   100% of 10 min visit spent on counseling and coordination of care.   Assessment:    2 weeks postpartum.  Doing well.  Contraceptive counseling and advice.  Husband has a vasectomy.  Plan:    1. Contraception: vasectomy 2. Continue PNV's and stool softeners 3. Follow up in: 4 weeks or as needed.    2hr GTT for h/o GDM/screening for DM q 3 yrs per ADA recommendations  Healthy lifestyle practices reviewed

## 2015-12-28 ENCOUNTER — Ambulatory Visit (INDEPENDENT_AMBULATORY_CARE_PROVIDER_SITE_OTHER): Payer: Medicaid Other | Admitting: Obstetrics

## 2015-12-29 ENCOUNTER — Encounter: Payer: Self-pay | Admitting: Obstetrics

## 2015-12-29 NOTE — Progress Notes (Signed)
Subjective:     Alison Greer is a 40 y.o. female who presents for a postpartum visit. She is 4 weeks postpartum following a spontaneous vaginal delivery. I have fully reviewed the prenatal and intrapartum course. The delivery was at 39 gestational weeks. Outcome: spontaneous vaginal delivery. Anesthesia: epidural. Postpartum course has been normal. Baby's course has been normal. Baby is feeding by both breast and bottle - Similac Advance. Bleeding no bleeding. Bowel function is normal. Bladder function is normal. Patient is sexually active. Contraception method is vasectomy. Postpartum depression screening: negative.  Tobacco, alcohol and substance abuse history reviewed.  Adult immunizations reviewed including TDAP, rubella and varicella.  The following portions of the patient's history were reviewed and updated as appropriate: allergies, current medications, past family history, past medical history, past social history, past surgical history and problem list.  Review of Systems A comprehensive review of systems was negative.   Objective:    BP 107/75 mmHg  Pulse 82  Wt 105 lb (47.628 kg)  Breastfeeding? Yes  General:  alert and no distress   Breasts:  inspection negative, no nipple discharge or bleeding, no masses or nodularity palpable  Lungs: clear to auscultation bilaterally  Heart:  regular rate and rhythm, S1, S2 normal, no murmur, click, rub or gallop  Abdomen: soft, non-tender; bowel sounds normal; no masses,  no organomegaly   Vulva:  normal  Vagina: normal vagina  Cervix:  no cervical motion tenderness  Corpus: normal size, contour, position, consistency, mobility, non-tender  Adnexa:  no mass, fullness, tenderness  Rectal Exam: Not performed.           Assessment:     Normal postpartum exam. Pap smear not done at today's visit.    Plan:    1. Contraception: vasectomy 2. Continue PNV's 3. Follow up in: 3 months or as needed.    Healthy lifestyle practices  reviewed

## 2016-02-24 DIAGNOSIS — R7309 Other abnormal glucose: Secondary | ICD-10-CM | POA: Diagnosis not present

## 2016-02-24 DIAGNOSIS — I959 Hypotension, unspecified: Secondary | ICD-10-CM | POA: Diagnosis not present

## 2016-03-06 DIAGNOSIS — Z862 Personal history of diseases of the blood and blood-forming organs and certain disorders involving the immune mechanism: Secondary | ICD-10-CM | POA: Diagnosis not present

## 2016-03-30 ENCOUNTER — Ambulatory Visit: Payer: Medicaid Other | Admitting: Obstetrics

## 2016-04-03 DIAGNOSIS — Z0000 Encounter for general adult medical examination without abnormal findings: Secondary | ICD-10-CM | POA: Diagnosis not present

## 2016-04-24 ENCOUNTER — Encounter: Payer: Self-pay | Admitting: Obstetrics

## 2016-04-24 ENCOUNTER — Ambulatory Visit (INDEPENDENT_AMBULATORY_CARE_PROVIDER_SITE_OTHER): Payer: BLUE CROSS/BLUE SHIELD | Admitting: Obstetrics

## 2016-04-24 VITALS — BP 96/65 | HR 78 | Temp 98.5°F | Wt 105.0 lb

## 2016-04-24 DIAGNOSIS — Z3049 Encounter for surveillance of other contraceptives: Secondary | ICD-10-CM

## 2016-04-24 DIAGNOSIS — Z3202 Encounter for pregnancy test, result negative: Secondary | ICD-10-CM | POA: Diagnosis not present

## 2016-04-24 DIAGNOSIS — Z309 Encounter for contraceptive management, unspecified: Secondary | ICD-10-CM

## 2016-04-24 DIAGNOSIS — Z01419 Encounter for gynecological examination (general) (routine) without abnormal findings: Secondary | ICD-10-CM

## 2016-04-24 DIAGNOSIS — Z1389 Encounter for screening for other disorder: Secondary | ICD-10-CM | POA: Diagnosis not present

## 2016-04-24 DIAGNOSIS — Z3200 Encounter for pregnancy test, result unknown: Secondary | ICD-10-CM

## 2016-04-24 LAB — POCT URINALYSIS DIPSTICK
BILIRUBIN UA: NEGATIVE
Blood, UA: NEGATIVE
GLUCOSE UA: NEGATIVE
LEUKOCYTES UA: NEGATIVE
NITRITE UA: NEGATIVE
Protein, UA: NEGATIVE
Spec Grav, UA: <=1.005
Urobilinogen, UA: NEGATIVE
pH, UA: 7

## 2016-04-24 LAB — POCT URINE PREGNANCY: Preg Test, Ur: NEGATIVE

## 2016-04-24 NOTE — Progress Notes (Signed)
Subjective:        Alison Greer is a 40 y.o. female here for a routine exam.  Current complaints: None.    Personal health questionnaire:  Is patient Ashkenazi Jewish, have a family history of breast and/or ovarian cancer: no Is there a family history of uterine cancer diagnosed at age < 6250, gastrointestinal cancer, urinary tract cancer, family member who is a Personnel officerLynch syndrome-associated carrier: no Is the patient overweight and hypertensive, family history of diabetes, personal history of gestational diabetes, preeclampsia or PCOS: no Is patient over 4055, have PCOS,  family history of premature CHD under age 38, diabetes, smoke, have hypertension or peripheral artery disease:  no At any time, has a partner hit, kicked or otherwise hurt or frightened you?: no Over the past 2 weeks, have you felt down, depressed or hopeless?: no Over the past 2 weeks, have you felt little interest or pleasure in doing things?:no   Gynecologic History No LMP recorded. Contraception: vasectomy Last Pap: 2015. Results were: normal Last mammogram: n/a. Results were: n/a  Obstetric History OB History  Gravida Para Term Preterm AB SAB TAB Ectopic Multiple Living  3 3 3       0 3    # Outcome Date GA Lbr Len/2nd Weight Sex Delivery Anes PTL Lv  3 Term 11/17/15 [id number] 01:06 / 00:28 5 lb 8.7 oz (2.515 kg) F Vag-Spont EPI  Y  2 Term 05/13/10 2660w0d  6 lb 4 oz (2.835 kg) M Vag-Spont EPI N Y     Complications: Umbilical cord complication during labor and delivery  1 Term 06/26/05 7874w0d  6 lb 3 oz (2.807 kg) M Vag-Vacuum EPI N Y      Past Medical History  Diagnosis Date  . Anemia     Past Surgical History  Procedure Laterality Date  . Breast surgery    . Wisdom tooth extraction       Current outpatient prescriptions:  .  Budesonide (RHINOCORT ALLERGY NA), Place 1 Dose into the nose daily. Reported on 12/28/2015, Disp: , Rfl:  .  ibuprofen (ADVIL,MOTRIN) 600 MG tablet, Take 1 tablet (600 mg total) by  mouth 3 (three) times daily., Disp: 90 tablet, Rfl: 3 .  mirtazapine (REMERON) 15 MG tablet, Take 7.5 mg by mouth at bedtime. Half of a tablet daily, Disp: , Rfl:  .  Omega-3 Fatty Acids (OMEGA 3 PO), Take 1 capsule by mouth daily. Reported on 12/28/2015, Disp: , Rfl:  .  oxyCODONE-acetaminophen (PERCOCET) 5-325 MG tablet, Take 1-2 tablets by mouth every 4 (four) hours as needed for severe pain. (Patient not taking: Reported on 12/28/2015), Disp: 45 tablet, Rfl: 0 .  Prenatal Vit-Fe Fumarate-FA (PRENATAL MULTIVITAMIN) TABS tablet, Take 1 tablet by mouth daily at 12 noon. Reported on 12/28/2015, Disp: , Rfl:  .  ranitidine (ZANTAC) 150 MG tablet, Take 1 tablet (150 mg total) by mouth 2 (two) times daily. (Patient not taking: Reported on 11/30/2015), Disp: 60 tablet, Rfl: 5 .  senna-docusate (SENOKOT-S) 8.6-50 MG tablet, Take 2 tablets by mouth at bedtime as needed for mild constipation., Disp: 90 tablet, Rfl: 1 Allergies  Allergen Reactions  . Azithromycin Nausea And Vomiting     Social History  Substance Use Topics  . Smoking status: Never Smoker   . Smokeless tobacco: Never Used  . Alcohol Use: No     Comment: rare    History reviewed. No pertinent family history.    Review of Systems  Constitutional: negative for fatigue and  weight loss Respiratory: negative for cough and wheezing Cardiovascular: negative for chest pain, fatigue and palpitations Gastrointestinal: negative for abdominal pain and change in bowel habits Musculoskeletal:negative for myalgias Neurological: negative for gait problems and tremors Behavioral/Psych: negative for abusive relationship, depression Endocrine: negative for temperature intolerance   Genitourinary:negative for abnormal menstrual periods, genital lesions, hot flashes, sexual problems and vaginal discharge Integument/breast: negative for breast lump, breast tenderness, nipple discharge and skin lesion(s)    Objective:       There were no vitals  taken for this visit. General:   alert  Skin:   no rash or abnormalities  Lungs:   clear to auscultation bilaterally  Heart:   regular rate and rhythm, S1, S2 normal, no murmur, click, rub or gallop  Breasts:   normal without suspicious masses, skin or nipple changes or axillary nodes  Abdomen:  normal findings: no organomegaly, soft, non-tender and no hernia  Pelvis:  External genitalia: normal general appearance Urinary system: urethral meatus normal and bladder without fullness, nontender Vaginal: normal without tenderness, induration or masses Cervix: normal appearance Adnexa: normal bimanual exam Uterus: anteverted and non-tender, normal size   Lab Review Urine pregnancy test Labs reviewed yes Radiologic studies reviewed no    Assessment:    Healthy female exam.    Plan:    Education reviewed: calcium supplements, depression evaluation, low fat, low cholesterol diet, self breast exams, skin cancer screening and weight bearing exercise. Contraception: vasectomy. Follow up in: 1 year.   No orders of the defined types were placed in this encounter.   No orders of the defined types were placed in this encounter.

## 2016-04-25 DIAGNOSIS — Z01419 Encounter for gynecological examination (general) (routine) without abnormal findings: Secondary | ICD-10-CM | POA: Diagnosis not present

## 2016-04-27 LAB — PAP IG AND HPV HIGH-RISK
HPV, high-risk: NEGATIVE
PAP SMEAR COMMENT: 0

## 2016-04-29 ENCOUNTER — Other Ambulatory Visit: Payer: Self-pay | Admitting: Obstetrics

## 2016-04-29 DIAGNOSIS — B3731 Acute candidiasis of vulva and vagina: Secondary | ICD-10-CM

## 2016-04-29 DIAGNOSIS — B373 Candidiasis of vulva and vagina: Secondary | ICD-10-CM

## 2016-04-29 MED ORDER — FLUCONAZOLE 150 MG PO TABS: 150.0000 mg | ORAL_TABLET | Freq: Once | ORAL | Status: DC

## 2016-04-30 LAB — NUSWAB VG, CANDIDA 6SP
CANDIDA GLABRATA, NAA: NEGATIVE
Candida albicans, NAA: POSITIVE — AB
Candida krusei, NAA: NEGATIVE
Candida lusitaniae, NAA: NEGATIVE
Candida parapsilosis, NAA: NEGATIVE
Candida tropicalis, NAA: NEGATIVE
Trich vag by NAA: NEGATIVE

## 2016-05-05 ENCOUNTER — Other Ambulatory Visit: Payer: Self-pay | Admitting: Obstetrics

## 2016-05-18 ENCOUNTER — Ambulatory Visit (INDEPENDENT_AMBULATORY_CARE_PROVIDER_SITE_OTHER): Payer: BLUE CROSS/BLUE SHIELD | Admitting: Obstetrics

## 2016-05-18 ENCOUNTER — Encounter: Payer: Self-pay | Admitting: Obstetrics

## 2016-05-18 VITALS — BP 116/75 | HR 82 | Temp 97.7°F | Wt 104.8 lb

## 2016-05-18 DIAGNOSIS — Z308 Encounter for other contraceptive management: Secondary | ICD-10-CM | POA: Diagnosis not present

## 2016-05-18 DIAGNOSIS — Z3009 Encounter for other general counseling and advice on contraception: Secondary | ICD-10-CM

## 2016-05-18 DIAGNOSIS — Z3202 Encounter for pregnancy test, result negative: Secondary | ICD-10-CM

## 2016-05-18 LAB — POCT URINE PREGNANCY: Preg Test, Ur: NEGATIVE

## 2016-05-18 NOTE — Progress Notes (Signed)
Subjective:    Alison Greer is a 40 y.o. female who presents for contraception counseling. The patient has no complaints today.  She requests fitting for a diaphragm. The patient is sexually active. Pertinent past medical history: none.  The information documented in the HPI was reviewed and verified.  Menstrual History: OB History    Gravida Para Term Preterm AB TAB SAB Ectopic Multiple Living   3 3 3       0 3       No LMP recorded.   Patient Active Problem List   Diagnosis Date Noted  . AMA (advanced maternal age) multigravida 35+ 11/17/2015  . NSVD (normal spontaneous vaginal delivery) 11/17/2015  . Advanced maternal age in multigravida   . Underweight   . [redacted] weeks gestation of pregnancy    Past Medical History  Diagnosis Date  . Anemia     Past Surgical History  Procedure Laterality Date  . Breast surgery    . Wisdom tooth extraction       Current outpatient prescriptions:  .  Budesonide (RHINOCORT ALLERGY NA), Place 1 Dose into the nose daily. Reported on 12/28/2015, Disp: , Rfl:  .  fluconazole (DIFLUCAN) 150 MG tablet, Take 1 tablet (150 mg total) by mouth once., Disp: 1 tablet, Rfl: 2 .  ibuprofen (ADVIL,MOTRIN) 600 MG tablet, Take 1 tablet (600 mg total) by mouth 3 (three) times daily., Disp: 90 tablet, Rfl: 3 .  mirtazapine (REMERON) 15 MG tablet, Take 7.5 mg by mouth at bedtime. Half of a tablet daily, Disp: , Rfl:  .  Omega-3 Fatty Acids (OMEGA 3 PO), Take 1 capsule by mouth daily. Reported on 12/28/2015, Disp: , Rfl:  .  Prenatal Vit-Fe Fumarate-FA (PRENATAL MULTIVITAMIN) TABS tablet, Take 1 tablet by mouth daily at 12 noon. Reported on 12/28/2015, Disp: , Rfl:  .  senna-docusate (SENOKOT-S) 8.6-50 MG tablet, Take 2 tablets by mouth at bedtime as needed for mild constipation., Disp: 90 tablet, Rfl: 1 Allergies  Allergen Reactions  . Azithromycin Nausea And Vomiting    Social History  Substance Use Topics  . Smoking status: Never Smoker   . Smokeless  tobacco: Never Used  . Alcohol Use: No     Comment: rare    No family history on file.     Review of Systems Constitutional: negative for weight loss Genitourinary:negative for abnormal menstrual periods and vaginal discharge   Objective:   BP 116/75 mmHg  Pulse 82  Temp(Src) 97.7 F (36.5 C)  Wt 104 lb 12.8 oz (47.537 kg)             General:  Alert and no distress Abdomen:  normal findings: no organomegaly, soft, non-tender and no hernia  Pelvis:  External genitalia: normal general appearance Urinary system: urethral meatus normal and bladder without fullness, nontender Vaginal: normal without tenderness, induration or masses Cervix: normal appearance Adnexa: normal bimanual exam Uterus: anteverted and non-tender, normal size  Diaphragm Fitting       #65 Omniflex Diaphragm fitted without discomfort       Patient felt and removed diaphragm without difficulty    Lab Review Urine pregnancy test Labs reviewed yes Radiologic studies reviewed yes  Assessment:    40 y.o., starting diaphragm, no contraindications.   Plan:    All questions answered. Contraception: diaphragm. Discussed healthy lifestyle modifications. Agricultural engineerducational material distributed. Follow up as needed.    No orders of the defined types were placed in this encounter.   Orders Placed This Encounter  Procedures  . POCT urine pregnancy

## 2016-05-30 DIAGNOSIS — G479 Sleep disorder, unspecified: Secondary | ICD-10-CM | POA: Diagnosis not present

## 2016-07-06 DIAGNOSIS — F418 Other specified anxiety disorders: Secondary | ICD-10-CM | POA: Diagnosis not present

## 2016-07-06 DIAGNOSIS — Z23 Encounter for immunization: Secondary | ICD-10-CM | POA: Diagnosis not present

## 2016-07-06 DIAGNOSIS — R42 Dizziness and giddiness: Secondary | ICD-10-CM | POA: Diagnosis not present

## 2016-08-15 DIAGNOSIS — F331 Major depressive disorder, recurrent, moderate: Secondary | ICD-10-CM | POA: Diagnosis not present

## 2016-08-29 ENCOUNTER — Other Ambulatory Visit: Payer: Self-pay | Admitting: Obstetrics

## 2016-08-29 DIAGNOSIS — B373 Candidiasis of vulva and vagina: Secondary | ICD-10-CM

## 2016-08-29 DIAGNOSIS — B3731 Acute candidiasis of vulva and vagina: Secondary | ICD-10-CM

## 2016-08-29 NOTE — Telephone Encounter (Signed)
Please review and refill if approved.

## 2016-09-25 DIAGNOSIS — F331 Major depressive disorder, recurrent, moderate: Secondary | ICD-10-CM | POA: Diagnosis not present

## 2016-10-26 DIAGNOSIS — J329 Chronic sinusitis, unspecified: Secondary | ICD-10-CM | POA: Diagnosis not present

## 2016-10-26 DIAGNOSIS — S90121A Contusion of right lesser toe(s) without damage to nail, initial encounter: Secondary | ICD-10-CM | POA: Diagnosis not present

## 2016-10-27 DIAGNOSIS — S90111A Contusion of right great toe without damage to nail, initial encounter: Secondary | ICD-10-CM | POA: Diagnosis not present

## 2016-11-07 DIAGNOSIS — T753XXA Motion sickness, initial encounter: Secondary | ICD-10-CM | POA: Diagnosis not present

## 2016-11-07 DIAGNOSIS — S90211A Contusion of right great toe with damage to nail, initial encounter: Secondary | ICD-10-CM | POA: Diagnosis not present

## 2017-01-11 ENCOUNTER — Telehealth: Payer: Self-pay | Admitting: *Deleted

## 2017-01-11 ENCOUNTER — Other Ambulatory Visit: Payer: Self-pay | Admitting: Obstetrics

## 2017-01-11 NOTE — Telephone Encounter (Signed)
Patient is calling about her cycle: Patient is breast feeding only at night. Her husband has had a vasectomy- no hormonal birth control. No cramping and normally light cycles- may even skip months. Patient reports she had a light cycle last month but started bleeding yesterday and the flow has picked up considerably over the evening and today. She is using a super tampon every 2 hours since last night. Her only complaint is that the flow is not normal for her. She wants to know what Dr Clearance CootsHarper would suggest to do.

## 2017-02-26 DIAGNOSIS — F3342 Major depressive disorder, recurrent, in full remission: Secondary | ICD-10-CM | POA: Diagnosis not present

## 2017-04-25 ENCOUNTER — Ambulatory Visit: Payer: Self-pay | Admitting: Obstetrics

## 2017-05-08 ENCOUNTER — Encounter (INDEPENDENT_AMBULATORY_CARE_PROVIDER_SITE_OTHER): Payer: Self-pay | Admitting: Orthopaedic Surgery

## 2017-05-08 ENCOUNTER — Other Ambulatory Visit (INDEPENDENT_AMBULATORY_CARE_PROVIDER_SITE_OTHER): Payer: Self-pay

## 2017-05-08 ENCOUNTER — Ambulatory Visit (INDEPENDENT_AMBULATORY_CARE_PROVIDER_SITE_OTHER): Payer: Self-pay

## 2017-05-08 ENCOUNTER — Ambulatory Visit (INDEPENDENT_AMBULATORY_CARE_PROVIDER_SITE_OTHER): Payer: BLUE CROSS/BLUE SHIELD | Admitting: Orthopaedic Surgery

## 2017-05-08 VITALS — BP 97/68 | HR 94 | Ht 65.0 in | Wt 110.0 lb

## 2017-05-08 DIAGNOSIS — M79671 Pain in right foot: Secondary | ICD-10-CM

## 2017-05-08 DIAGNOSIS — S9031XA Contusion of right foot, initial encounter: Secondary | ICD-10-CM

## 2017-05-08 NOTE — Progress Notes (Signed)
Office Visit Note   Patient: Alison Greer           Date of Birth: 08/18/1976           MRN: [id number] Visit Date: 05/08/2017              Requested by: No referring provider defined for this encounter. PCP: Daisy Florooss, Charles Alan, MD   Assessment & Plan: Visit Diagnoses:  1. Contusion of foot or heel, right, initial encounter   2. Pain in right foot     Plan:  #1: [place] heel pad was given for the right foot #2: Pain increase activities after this coming week #3: Follow back up if her symptoms do not improve.  Follow-Up Instructions: Return if symptoms worsen or fail to improve.   Orders:  Orders Placed This Encounter  Procedures  . XR Ankle Complete Right  . XR Foot Complete Right   No orders of the defined types were placed in this encounter.     Procedures: No procedures performed   Clinical Data: No additional findings.   Subjective: Chief Complaint  Patient presents with  . Right Ankle - Pain    Pt was going down the stairs 3 days ago when she mis-stepped while carrying her daughter. She relates the pain started in her ankle and now into her heel.     Alison Greer is a very pleasant 41 year old white female who is seen today for evaluation of her right foot. She was going down the stairs 3 days ago when she mis-stepped while carrying her 39-year-old daughter. She relates the pain started in her ankle and now has centered into her heel. She states is improving but would like to have it checked to make sure she does not have a fracture.     Review of Systems  Constitutional: Negative.   HENT: Negative.   Respiratory: Negative.   Cardiovascular: Negative.   Gastrointestinal: Negative.   Genitourinary: Negative.   Skin: Negative.   Neurological: Negative.   Hematological: Negative.   Psychiatric/Behavioral: Negative.      Objective: Vital Signs: BP 97/68   Pulse 94   Ht 5\' 5"  (1.651 m)   Wt 110 lb (49.9 kg)   BMI 18.30 kg/m   Physical Exam   Constitutional: She is oriented to person, place, and time. She appears well-developed and well-nourished.  Thin  HENT:  Head: Normocephalic and atraumatic.  Eyes: EOM are normal. Pupils are equal, round, and reactive to light.  Pulmonary/Chest: Effort normal.  Neurological: She is alert and oriented to person, place, and time.  Skin: Skin is warm and dry.  Psychiatric: She has a normal mood and affect. Her behavior is normal. Judgment and thought content normal.    Ortho Exam  Today she has good range of motion of the ankle and foot. No pain with the squeeze test. No pain to palpation. She can dorsiflex the foot without difficulty. I was unable to find any point on the foot and ankle that were tender. No edema or swelling. Supple and nontender.  Specialty Comments:  No specialty comments available.  Imaging: Xr Ankle Complete Right  Result Date: 05/08/2017 Three-view x-ray of the right ankle is essentially unremarkable. May have some regional osteopenia noted.  Xr Foot Complete Right  Result Date: 05/08/2017 Three-view x-ray of the right ankle is essentially unremarkable. May have some regional osteopenia this is more in the navicular area of the foot medially.     PMFS History: Patient Active  Problem List   Diagnosis Date Noted  . AMA (advanced maternal age) multigravida 35+ 11/17/2015  . NSVD (normal spontaneous vaginal delivery) 11/17/2015  . Advanced maternal age in multigravida   . Underweight   . [redacted] weeks gestation of pregnancy    Past Medical History:  Diagnosis Date  . Anemia     No family history on file.  Past Surgical History:  Procedure Laterality Date  . BREAST SURGERY    . WISDOM TOOTH EXTRACTION     Social History   Occupational History  . Not on file.   Social History Main Topics  . Smoking status: Never Smoker  . Smokeless tobacco: Never Used  . Alcohol use No     Comment: rare  . Drug use: No  . Sexual activity: Yes    Partners: Male     Birth control/ protection: None

## 2017-05-11 ENCOUNTER — Ambulatory Visit: Payer: Medicaid Other | Admitting: Obstetrics

## 2017-05-29 ENCOUNTER — Ambulatory Visit (INDEPENDENT_AMBULATORY_CARE_PROVIDER_SITE_OTHER): Payer: BLUE CROSS/BLUE SHIELD | Admitting: Obstetrics

## 2017-05-29 ENCOUNTER — Encounter: Payer: Self-pay | Admitting: Obstetrics

## 2017-05-29 VITALS — BP 101/66 | HR 80

## 2017-05-29 DIAGNOSIS — N898 Other specified noninflammatory disorders of vagina: Secondary | ICD-10-CM

## 2017-05-29 DIAGNOSIS — Z1151 Encounter for screening for human papillomavirus (HPV): Secondary | ICD-10-CM

## 2017-05-29 DIAGNOSIS — R636 Underweight: Secondary | ICD-10-CM

## 2017-05-29 DIAGNOSIS — E46 Unspecified protein-calorie malnutrition: Secondary | ICD-10-CM

## 2017-05-29 DIAGNOSIS — Z113 Encounter for screening for infections with a predominantly sexual mode of transmission: Secondary | ICD-10-CM

## 2017-05-29 DIAGNOSIS — Z124 Encounter for screening for malignant neoplasm of cervix: Secondary | ICD-10-CM | POA: Diagnosis not present

## 2017-05-29 DIAGNOSIS — D500 Iron deficiency anemia secondary to blood loss (chronic): Secondary | ICD-10-CM

## 2017-05-29 DIAGNOSIS — K5901 Slow transit constipation: Secondary | ICD-10-CM

## 2017-05-29 DIAGNOSIS — Z01419 Encounter for gynecological examination (general) (routine) without abnormal findings: Secondary | ICD-10-CM | POA: Diagnosis not present

## 2017-05-29 MED ORDER — CITRANATAL BLOOM 90-1 MG PO TABS: 1.0000 | ORAL_TABLET | Freq: Every day | ORAL | 11 refills | Status: DC

## 2017-05-29 NOTE — Progress Notes (Signed)
Subjective:        Alison Greer is a 41 y.o. female here for a routine exam.  Current complaints: Periods are heavier, lasting 4 days.. Also has chronic constipation.  Takes stool softeners to stay regular.   Personal health questionnaire:  Is patient Ashkenazi Jewish, have a family history of breast and/or ovarian cancer: no Is there a family history of uterine cancer diagnosed at age < 7250, gastrointestinal cancer, urinary tract cancer, family member who is a Personnel officerLynch syndrome-associated carrier: no Is the patient overweight and hypertensive, family history of diabetes, personal history of gestational diabetes, preeclampsia or PCOS: no Is patient over 2855, have PCOS,  family history of premature CHD under age 165, diabetes, smoke, have hypertension or peripheral artery disease:  no At any time, has a partner hit, kicked or otherwise hurt or frightened you?: no Over the past 2 weeks, have you felt down, depressed or hopeless?: no Over the past 2 weeks, have you felt little interest or pleasure in doing things?:no   Gynecologic History Patient's last menstrual period was 05/07/2017 (approximate). Contraception: vasectomy Last Pap: 2017. Results were: normal Last mammogram: n/a. Results were: n/a  Obstetric History OB History  Gravida Para Term Preterm AB Living  3 3 3     3   SAB TAB Ectopic Multiple Live Births        0 3    # Outcome Date GA Lbr Len/2nd Weight Sex Delivery Anes PTL Lv  3 Term 11/17/15 2479w0d 01:06 / 00:28 5 lb 8.7 oz (2.515 kg) F Vag-Spont EPI  LIV  2 Term 05/13/10 4179w0d  6 lb 4 oz (2.835 kg) M Vag-Spont EPI N LIV     Complications: Umbilical cord complication during labor and delivery  1 Term 06/26/05 3566w0d  6 lb 3 oz (2.807 kg) M Vag-Vacuum EPI N LIV      Past Medical History:  Diagnosis Date  . Anemia     Past Surgical History:  Procedure Laterality Date  . BREAST SURGERY    . WISDOM TOOTH EXTRACTION       Current Outpatient Prescriptions:  .   Budesonide (RHINOCORT ALLERGY NA), Place 1 Dose into the nose daily. Reported on 12/28/2015, Disp: , Rfl:  .  buPROPion (WELLBUTRIN) 75 MG tablet, Take 75 mg by mouth 2 (two) times daily., Disp: , Rfl:  .  Omega-3 Fatty Acids (OMEGA 3 PO), Take 1 capsule by mouth daily. Reported on 12/28/2015, Disp: , Rfl:  .  Prenatal Vit-Fe Fumarate-FA (PRENATAL MULTIVITAMIN) TABS tablet, Take 1 tablet by mouth daily at 12 noon. Reported on 12/28/2015, Disp: , Rfl:  .  senna-docusate (SENOKOT-S) 8.6-50 MG tablet, Take 2 tablets by mouth at bedtime as needed for mild constipation., Disp: 90 tablet, Rfl: 1 .  Prenatal-DSS-FeCb-FeGl-FA (CITRANATAL BLOOM) 90-1 MG TABS, Take 1 tablet by mouth daily before breakfast., Disp: 30 tablet, Rfl: 11 Allergies  Allergen Reactions  . Azithromycin Nausea And Vomiting    Social History  Substance Use Topics  . Smoking status: Never Smoker  . Smokeless tobacco: Never Used  . Alcohol use No    History reviewed. No pertinent family history.    Review of Systems  Constitutional: negative for fatigue and weight loss Respiratory: negative for cough and wheezing Cardiovascular: negative for chest pain, fatigue and palpitations Gastrointestinal: negative for abdominal pain and change in bowel habits Musculoskeletal:negative for myalgias Neurological: negative for gait problems and tremors Behavioral/Psych: negative for abusive relationship, depression Endocrine: negative for temperature intolerance  Genitourinary:negative for abnormal menstrual periods, genital lesions, hot flashes, sexual problems and vaginal discharge Integument/breast: negative for breast lump, breast tenderness, nipple discharge and skin lesion(s)    Objective:       BP 101/66   Pulse 80   LMP 05/07/2017 (Approximate)  General:   alert  Skin:   no rash or abnormalities  Lungs:   clear to auscultation bilaterally  Heart:   regular rate and rhythm, S1, S2 normal, no murmur, click, rub or gallop   Breasts:   normal without suspicious masses, skin or nipple changes or axillary nodes  Abdomen:  normal findings: no organomegaly, soft, non-tender and no hernia  Pelvis:  External genitalia: normal general appearance Urinary system: urethral meatus normal and bladder without fullness, nontender Vaginal: normal without tenderness, induration or masses Cervix: normal appearance Adnexa: normal bimanual exam Uterus: anteverted and non-tender, normal size   Lab Review Urine pregnancy test Labs reviewed yes Radiologic studies reviewed no  50% of 20 min visit spent on counseling and coordination of care.    Assessment:     1. Encounter for gynecological examination with Papanicolaou smear of cervix Rx: - Cytology - PAP  2. Iron deficiency anemia due to chronic blood loss Rx: - Hemoglobin - Hematocrit - Prenatal-DSS-FeCb-FeGl-FA (CITRANATAL BLOOM) 90-1 MG TABS; Take 1 tablet by mouth daily before breakfast.  Dispense: 30 tablet; Refill: 11  3. Vaginal discharge Rx: - Cervicovaginal ancillary only  4. Chronic constipation - taking stool softeners to maintain regularity  - poor dietary intake  5. Underweight - malnourished.     Plan:    Education reviewed: calcium supplements, depression evaluation, low fat, low cholesterol diet, self breast exams and weight bearing exercise. Contraception: vasectomy. Follow up in: 1 year.   Meds ordered this encounter  Medications  . Prenatal-DSS-FeCb-FeGl-FA (CITRANATAL BLOOM) 90-1 MG TABS    Sig: Take 1 tablet by mouth daily before breakfast.    Dispense:  30 tablet    Refill:  11   Orders Placed This Encounter  Procedures  . Hemoglobin  . Hematocrit     Patient ID: Alison Greer, female   DOB: 06/08/1976, 41 y.o.   MRN: [id number]

## 2017-05-29 NOTE — Progress Notes (Signed)
Patient is in the office for her annual exam. Patient c/o heavy cycles- for 4 months. Patient reports her cycles are on time. Patient still suffers with constipation- she is still using stool softeners. Patient is not sure if she is anemic.

## 2017-05-30 LAB — CBC
HEMATOCRIT: 39.6 % (ref 34.0–46.6)
Hemoglobin: 13.1 g/dL (ref 11.1–15.9)
MCH: 30.3 pg (ref 26.6–33.0)
MCHC: 33.1 g/dL (ref 31.5–35.7)
MCV: 92 fL (ref 79–97)
Platelets: 163 10*3/uL (ref 150–379)
RBC: 4.32 x10E6/uL (ref 3.77–5.28)
RDW: 13.4 % (ref 12.3–15.4)
WBC: 4.2 10*3/uL (ref 3.4–10.8)

## 2017-05-30 LAB — CYTOLOGY - PAP
DIAGNOSIS: NEGATIVE
HPV (WINDOPATH): NOT DETECTED

## 2017-05-30 LAB — CERVICOVAGINAL ANCILLARY ONLY
CHLAMYDIA, DNA PROBE: NEGATIVE
NEISSERIA GONORRHEA: NEGATIVE

## 2017-06-04 LAB — HEMATOCRIT

## 2017-06-04 LAB — HEMOGLOBIN

## 2017-06-12 ENCOUNTER — Other Ambulatory Visit: Payer: Self-pay | Admitting: Obstetrics

## 2017-06-12 DIAGNOSIS — Z0000 Encounter for general adult medical examination without abnormal findings: Secondary | ICD-10-CM

## 2017-06-12 MED ORDER — PRENATAL PLUS 27-1 MG PO TABS: 1.0000 | ORAL_TABLET | Freq: Every day | ORAL | 11 refills | Status: DC

## 2017-07-17 DIAGNOSIS — F331 Major depressive disorder, recurrent, moderate: Secondary | ICD-10-CM | POA: Diagnosis not present

## 2017-08-07 DIAGNOSIS — R5383 Other fatigue: Secondary | ICD-10-CM | POA: Diagnosis not present

## 2017-08-07 DIAGNOSIS — F418 Other specified anxiety disorders: Secondary | ICD-10-CM | POA: Diagnosis not present

## 2017-08-07 DIAGNOSIS — R17 Unspecified jaundice: Secondary | ICD-10-CM | POA: Diagnosis not present

## 2017-08-07 DIAGNOSIS — R636 Underweight: Secondary | ICD-10-CM | POA: Diagnosis not present

## 2017-09-07 ENCOUNTER — Encounter: Payer: Self-pay | Admitting: Vascular Surgery

## 2017-09-07 ENCOUNTER — Ambulatory Visit (INDEPENDENT_AMBULATORY_CARE_PROVIDER_SITE_OTHER): Payer: BLUE CROSS/BLUE SHIELD | Admitting: Vascular Surgery

## 2017-09-07 ENCOUNTER — Ambulatory Visit (HOSPITAL_COMMUNITY)
Admission: RE | Admit: 2017-09-07 | Discharge: 2017-09-07 | Disposition: A | Discharge status: Home / Self Care | Payer: BLUE CROSS/BLUE SHIELD | Source: Ambulatory Visit | Attending: Vascular Surgery | Admitting: Vascular Surgery

## 2017-09-07 VITALS — BP 105/73 | HR 86 | Temp 97.8°F | Resp 16 | Ht 65.0 in | Wt 104.0 lb

## 2017-09-07 DIAGNOSIS — I8290 Acute embolism and thrombosis of unspecified vein: Secondary | ICD-10-CM

## 2017-09-07 DIAGNOSIS — I82890 Acute embolism and thrombosis of other specified veins: Secondary | ICD-10-CM | POA: Insufficient documentation

## 2017-09-07 DIAGNOSIS — I878 Other specified disorders of veins: Secondary | ICD-10-CM | POA: Diagnosis not present

## 2017-09-07 NOTE — Progress Notes (Signed)
Requested by:  Daisy Florooss, Charles Alan, MD [address] [place], [place] [id number]  Reason for consultation: prominent left neck vein    History of Present Illness   Alison Greer is a 41 y.o. (05/08/1976) female who presents with cc: prominent left neck vein.  Pt's mother recently remarked on an enlarged left neck vein.  Patient denies any prior clotting history.  She has had not left arm swelling.  She denies any sx consistent thoracic outlet syndrome.  She denies any thrombophilia or trauma history.   Past Medical History:  Diagnosis Date  . Anemia     Past Surgical History:  Procedure Laterality Date  . BREAST SURGERY    . WISDOM TOOTH EXTRACTION       Social History   Socioeconomic History  . Marital status: Married    Spouse name: Not on file  . Number of children: Not on file  . Years of education: Not on file  . Highest education level: Not on file  Social Needs  . Financial resource strain: Not on file  . Food insecurity - worry: Not on file  . Food insecurity - inability: Not on file  . Transportation needs - medical: Not on file  . Transportation needs - non-medical: Not on file  Occupational History  . Not on file  Tobacco Use  . Smoking status: Never Smoker  . Smokeless tobacco: Never Used  Substance and Sexual Activity  . Alcohol use: No    Alcohol/week: 0.0 oz  . Drug use: No  . Sexual activity: Yes    Partners: Male    Birth control/protection: None  Other Topics Concern  . Not on file  Social History Narrative  . Not on file    Family History: patient is unable to detail the medical history of his parents   Current Outpatient Medications  Medication Sig Dispense Refill  . Budesonide (RHINOCORT ALLERGY NA) Place 1 Dose into the nose daily. Reported on 12/28/2015    . buPROPion (WELLBUTRIN) 75 MG tablet Take 75 mg by mouth 2 (two) times daily.    . Omega-3 Fatty Acids (OMEGA 3 PO) Take 1 capsule by mouth daily. Reported on 12/28/2015      . Prenatal Vit-Fe Fumarate-FA (PRENATAL MULTIVITAMIN) TABS tablet Take 1 tablet by mouth daily at 12 noon. Reported on 12/28/2015    . prenatal vitamin w/FE, FA (PRENATAL 1 + 1) 27-1 MG TABS tablet Take 1 tablet by mouth daily before breakfast. 30 tablet 11  . Prenatal-DSS-FeCb-FeGl-FA (CITRANATAL BLOOM) 90-1 MG TABS Take 1 tablet by mouth daily before breakfast. 30 tablet 11  . senna-docusate (SENOKOT-S) 8.6-50 MG tablet Take 2 tablets by mouth at bedtime as needed for mild constipation. 90 tablet 1   No current facility-administered medications for this visit.     Allergies  Allergen Reactions  . Azithromycin Nausea And Vomiting    REVIEW OF SYSTEMS (negative unless checked):   Cardiac:  []  Chest pain or chest pressure? []  Shortness of breath upon activity? []  Shortness of breath when lying flat? []  Irregular heart rhythm?  Vascular:  []  Pain in calf, thigh, or hip brought on by walking? []  Pain in feet at night that wakes you up from your sleep? []  Blood clot in your veins? []  Leg swelling?  Pulmonary:  []  Oxygen at home? []  Productive cough? []  Wheezing?  Neurologic:  []  Sudden weakness in arms or legs? []  Sudden numbness in arms or legs? []  Sudden onset of  difficult speaking or slurred speech? []  Temporary loss of vision in one eye? []  Problems with dizziness?  Gastrointestinal:  []  Blood in stool? []  Vomited blood?  Genitourinary:  []  Burning when urinating? []  Blood in urine?  Psychiatric:  []  Major depression  Hematologic:  []  Bleeding problems? []  Problems with blood clotting?  Dermatologic:  []  Rashes or ulcers?  Constitutional:  []  Fever or chills?  Ear/Nose/Throat:  []  Change in hearing? []  Nose bleeds? []  Sore throat?  Musculoskeletal:  []  Back pain? []  Joint pain? []  Muscle pain?   Physical Examination     Vitals:   09/07/17 1501  BP: 105/73  Pulse: 86  Resp: 16  Temp: 97.8 F (36.6 C)  TempSrc: Oral  SpO2: 100%   Weight: 104 lb (47.2 kg)  Height: 5\' 5"  (1.651 m)   Body mass index is 17.31 kg/m.  General Alert, O x 3, Cachectic, NAD  Head [place]/AT,    Ear/Nose/ Throat Hearing grossly intact, nares without erythema or drainage, oropharynx without Erythema or Exudate, Mallampati score: 3,   Eyes PERRLA, EOMI,    Neck Supple, mid-line trachea, prominent L anterior jugular vein (compressible), no neck masses, no posterior vein prominence  Pulmonary Sym exp, good B air movt, CTA B, no chest wall collaterals  Cardiac RRR, Nl S1, S2, no Murmurs, No rubs, No S3,S4  Vascular Vessel Right Left  Radial Palpable Palpable  Brachial Palpable Palpable  Carotid Palpable, No Bruit Palpable, No Bruit  Aorta Not palpable N/A  Femoral Palpable Palpable  Popliteal Not palpable Not palpable  PT Palpable Palpable  DP Palpable Palpable    Gastro- intestinal soft, non-distended, non-tender to palpation, No guarding or rebound, no HSM, no masses, no CVAT B, No palpable prominent aortic pulse,    Musculo- skeletal M/S 5/5 throughout  , Extremities without ischemic changes  , No edema present, No visible varicosities , No Lipodermatosclerosis present  Neurologic Cranial nerves 2-12 intact , Pain and light touch intact in extremities , Motor exam as listed above  Psychiatric Judgement intact, Mood & affect appropriate for pt's clinical situation  Dermatologic See M/S exam for extremity exam, No rashes otherwise noted  Lymphatic  Palpable lymph nodes: None    Non-invasive Vascular Imaging     LUE Venous Duplex (09/07/2017):   No DVT  Outside Studies/Documentation   4 pages of outside documents were reviewed including: PCP chart.   Medical Decision Making   Alison Greer is a 41 y.o. female who presents with: prominent Left neck vein of unknown etiology   Unclear what the etiology of this prominent left neck vein.  Most worrisome would be DVT but luckily this is negative on testing.  Patient doesn't  really have the history for venous TOS.  Additional testing would involve left arm and central venography.    I'm not exactly certain what the benefit for such would be, so I don't recommend proceeding as an anaphylactic reaction to the contrast is a definite risk of venography.  Pt would like to be referred for evaluation for cosmetic management of this vein.  Will try to arrange a Plastic Surgical evaluation for this patient.  Not certain who actively is interested in cosmetic management of such.    Thank you for allowing us to participate in this patient's care.   Leonides SakeBrian Reinaldo Helt, MD, FACS [facility] [place] Office: [phone] Pager: [phone]  09/07/2017, 3:18 PM

## 2017-09-28 DIAGNOSIS — R0989 Other specified symptoms and signs involving the circulatory and respiratory systems: Secondary | ICD-10-CM | POA: Diagnosis not present

## 2017-10-15 ENCOUNTER — Other Ambulatory Visit: Payer: Self-pay | Admitting: Family Medicine

## 2017-10-15 DIAGNOSIS — Q278 Other specified congenital malformations of peripheral vascular system: Secondary | ICD-10-CM

## 2017-10-16 DIAGNOSIS — Z23 Encounter for immunization: Secondary | ICD-10-CM | POA: Diagnosis not present

## 2017-10-16 DIAGNOSIS — L03012 Cellulitis of left finger: Secondary | ICD-10-CM | POA: Diagnosis not present

## 2017-11-28 DIAGNOSIS — F3342 Major depressive disorder, recurrent, in full remission: Secondary | ICD-10-CM | POA: Diagnosis not present

## 2017-12-14 ENCOUNTER — Telehealth (INDEPENDENT_AMBULATORY_CARE_PROVIDER_SITE_OTHER): Payer: Self-pay | Admitting: Orthopaedic Surgery

## 2017-12-14 NOTE — Telephone Encounter (Signed)
Patient calling about over lapping toe on lt foot. Patient has bought over the counter toe seperaters, but wants to know if she needs to come in for an appt , and get a custom insert made. Please call to advise.

## 2017-12-16 NOTE — Telephone Encounter (Signed)
Do we have any toe inserts we could give her?

## 2017-12-17 NOTE — Telephone Encounter (Signed)
She has toe separators and explained we did not do customized inserts.  She wanted to know if the is another DR that specializes in her problem

## 2017-12-18 NOTE — Telephone Encounter (Signed)
Not sure exactly the problem-could see her again or refer to Dr Lajoyce Cornersuda. Please call

## 2017-12-18 NOTE — Telephone Encounter (Signed)
LVMOM

## 2018-01-14 ENCOUNTER — Ambulatory Visit (INDEPENDENT_AMBULATORY_CARE_PROVIDER_SITE_OTHER): Payer: BLUE CROSS/BLUE SHIELD | Admitting: Orthopaedic Surgery

## 2018-01-14 ENCOUNTER — Encounter (INDEPENDENT_AMBULATORY_CARE_PROVIDER_SITE_OTHER): Payer: Self-pay | Admitting: Orthopaedic Surgery

## 2018-01-14 ENCOUNTER — Ambulatory Visit (INDEPENDENT_AMBULATORY_CARE_PROVIDER_SITE_OTHER): Payer: Self-pay

## 2018-01-14 VITALS — BP 99/72 | HR 83 | Resp 16 | Ht 67.0 in | Wt 103.0 lb

## 2018-01-14 DIAGNOSIS — M79641 Pain in right hand: Secondary | ICD-10-CM | POA: Diagnosis not present

## 2018-01-14 DIAGNOSIS — M2042 Other hammer toe(s) (acquired), left foot: Secondary | ICD-10-CM | POA: Diagnosis not present

## 2018-01-14 NOTE — Progress Notes (Signed)
Office Visit Note   Patient: Alison Greer           Date of Birth: Mar 14, 1976           MRN: [id number] Visit Date: 01/14/2018              Requested by: Daisy Floro, MD [address] [place], [place] [address] PCP: Daisy Floro, MD   Assessment & Plan: Visit Diagnoses:  1. Pain in right hand   2. Other hammer toe(s) (acquired), left foot     Plan: Early hammertoes second and third toes left foot.  No specific treatment.  Minimal symptoms.   comfortable shoes. Diffuse mild swelling of right index finger several months after developing an early paronychia treated by primary  care physician with antibiotics.  Seems to have some inflammation about the PIP joint but no evidence of infection.  Would monitor and try over-the-counter NSAIDs.  X-rays negative  Follow-Up Instructions: Return if symptoms worsen or fail to improve.   Orders:  Orders Placed This Encounter  Procedures  . XR Hand Complete Right   No orders of the defined types were placed in this encounter.     Procedures: No procedures performed   Clinical Data: No additional findings.   Subjective: Chief Complaint  Patient presents with  . New Patient (Initial Visit)    LEFT THIRD TOE POSSIBLE HAMMER TOE, BEEN GETTING WORSE. 2 MO AGO RIGHT POINTER FINGER GOT INFECTED, NOW ITS PAINFUL AGAIN  Alison Greer is concerned about apposition of the second and third toes of her left foot.  Not having any pain.  No injury.  Eyes any numbness or tingling.  No particular problem with her big toe or with calluses to the plantar aspect of her foot. Also relates that she has had some swelling of her right index finger for at least a month or 2.  She was diagnosed with an early paronychia by her  family physician several months ago and treated with antibiotics.  She has had some swelling of her finger since that time but no pain.  No loss of function  HPI  Review of Systems  Constitutional: Negative for fatigue  and fever.  HENT: Negative for ear pain.   Eyes: Negative for pain.  Respiratory: Negative for cough and shortness of breath.   Cardiovascular: Negative for leg swelling.  Gastrointestinal: Positive for constipation. Negative for diarrhea.  Endocrine: Negative for cold intolerance.  Genitourinary: Negative for dysuria.  Musculoskeletal: Positive for neck pain. Negative for back pain.  Skin: Negative for rash and wound.  Allergic/Immunologic: Negative for food allergies.  Neurological: Negative for dizziness, weakness, light-headedness, numbness and headaches.  Hematological: Bruises/bleeds easily.  Psychiatric/Behavioral: Negative for sleep disturbance.     Objective: Vital Signs: BP 99/72 (BP Location: Right Arm, Patient Position: Sitting, Cuff Size: Normal)   Pulse 83   Resp 16   Ht 5\' 7"  (1.702 m)   Wt 103 lb (46.7 kg)   BMI 16.13 kg/m   Physical Exam  Ortho Exam right hand with diffuse mild swelling of the index finger.  Some puffiness about the PIP joint but full flexion extension.  No evidence of any infection no local tenderness.  Nail is intact.  No erythema or ecchymosis.  No localized tenderness palm of the hand, MP or PIP joint.  No DIP joint pain.  Full flexion extension of the index finger.  No nail deformities.  No pain about the nail. Close Apposition of left  second and third toes.  No crossover deformity.  No plantar calluses.  No significant bunion.  No localized area of tenderness about any of the metatarsal phalangeal joints.  No swelling.  Skin intact.  Neurovascular exam intact     Specialty Comments:  No specialty comments available.  Imaging: Xr Hand Complete Right  Result Date: 01/14/2018 X-ray of the right index finger did not demonstrate any evidence of arthritis or acute change infection or fracture    PMFS History: Patient Active Problem List   Diagnosis Date Noted  . Prominent vein 09/07/2017  . AMA (advanced maternal age) multigravida 35+  11/17/2015  . NSVD (normal spontaneous vaginal delivery) 11/17/2015  . Advanced maternal age in multigravida   . Underweight   . [redacted] weeks gestation of pregnancy    Past Medical History:  Diagnosis Date  . Anemia     History reviewed. No pertinent family history.  Past Surgical History:  Procedure Laterality Date  . BREAST SURGERY    . WISDOM TOOTH EXTRACTION     Social History   Occupational History  . Not on file  Tobacco Use  . Smoking status: Never Smoker  . Smokeless tobacco: Never Used  Substance and Sexual Activity  . Alcohol use: No    Alcohol/week: 0.0 oz  . Drug use: No  . Sexual activity: Yes    Partners: Male    Birth control/protection: None

## 2018-03-12 ENCOUNTER — Ambulatory Visit: Payer: BLUE CROSS/BLUE SHIELD | Admitting: Obstetrics

## 2018-05-29 ENCOUNTER — Encounter

## 2018-06-03 ENCOUNTER — Other Ambulatory Visit: Payer: Self-pay | Admitting: Family Medicine

## 2018-06-03 DIAGNOSIS — Q278 Other specified congenital malformations of peripheral vascular system: Secondary | ICD-10-CM

## 2018-08-01 DIAGNOSIS — H66001 Acute suppurative otitis media without spontaneous rupture of ear drum, right ear: Secondary | ICD-10-CM | POA: Diagnosis not present

## 2018-08-01 DIAGNOSIS — J019 Acute sinusitis, unspecified: Secondary | ICD-10-CM | POA: Diagnosis not present

## 2018-10-01 DIAGNOSIS — J0110 Acute frontal sinusitis, unspecified: Secondary | ICD-10-CM | POA: Diagnosis not present

## 2020-05-05 ENCOUNTER — Encounter: Payer: Self-pay | Admitting: Obstetrics

## 2020-05-05 ENCOUNTER — Ambulatory Visit (INDEPENDENT_AMBULATORY_CARE_PROVIDER_SITE_OTHER): Payer: 59 | Admitting: Obstetrics

## 2020-05-05 ENCOUNTER — Other Ambulatory Visit (HOSPITAL_COMMUNITY)
Admission: RE | Admit: 2020-05-05 | Discharge: 2020-05-05 | Disposition: A | Discharge status: Home / Self Care | Payer: 59 | Source: Ambulatory Visit | Attending: Obstetrics | Admitting: Obstetrics

## 2020-05-05 ENCOUNTER — Other Ambulatory Visit: Payer: Self-pay

## 2020-05-05 VITALS — BP 90/63 | HR 82 | Ht 67.0 in | Wt 97.0 lb

## 2020-05-05 DIAGNOSIS — Z01419 Encounter for gynecological examination (general) (routine) without abnormal findings: Secondary | ICD-10-CM | POA: Insufficient documentation

## 2020-05-05 DIAGNOSIS — K5900 Constipation, unspecified: Secondary | ICD-10-CM | POA: Diagnosis not present

## 2020-05-05 DIAGNOSIS — Z1239 Encounter for other screening for malignant neoplasm of breast: Secondary | ICD-10-CM | POA: Diagnosis not present

## 2020-05-05 MED ORDER — CITRANATAL BLOOM 90-1 MG PO TABS: 1.0000 | ORAL_TABLET | Freq: Every day | ORAL | 11 refills | Status: DC

## 2020-05-05 NOTE — Progress Notes (Signed)
Subjective:        Alison Greer is a 44 y.o. female here for a routine exam.  Current complaints: Heavy 3 day cycles.  Also has chronic problem with constipation.  Personal health questionnaire:  Is patient Ashkenazi Jewish, have a family history of breast and/or ovarian cancer: no Is there a family history of uterine cancer diagnosed at age < 69, gastrointestinal cancer, urinary tract cancer, family member who is a Personnel officer syndrome-associated carrier: no Is the patient overweight and hypertensive, family history of diabetes, personal history of gestational diabetes, preeclampsia or PCOS: no Is patient over 17, have PCOS,  family history of premature CHD under age 62, diabetes, smoke, have hypertension or peripheral artery disease:  no At any time, has a partner hit, kicked or otherwise hurt or frightened you?: no Over the past 2 weeks, have you felt down, depressed or hopeless?: no Over the past 2 weeks, have you felt little interest or pleasure in doing things?:no   Gynecologic History Patient's last menstrual period was 04/13/2020. Contraception: vasectomy Last Pap: 2018. Results were: normal Last mammogram: None. Results were: None  Obstetric History OB History  Gravida Para Term Preterm AB Living  3 3 3     3   SAB TAB Ectopic Multiple Live Births        0 3    # Outcome Date GA Lbr Len/2nd Weight Sex Delivery Anes PTL Lv  3 Term 11/17/15 [redacted]w[redacted]d 01:06 / 00:28 5 lb 8.7 oz (2.515 kg) F Vag-Spont EPI  LIV  2 Term 05/13/10 [redacted]w[redacted]d  6 lb 4 oz (2.835 kg) M Vag-Spont EPI N LIV     Complications: Umbilical cord complication during labor and delivery  1 Term 06/26/05 [redacted]w[redacted]d  6 lb 3 oz (2.807 kg) M Vag-Vacuum EPI N LIV    Past Medical History:  Diagnosis Date  . Anemia     Past Surgical History:  Procedure Laterality Date  . BREAST SURGERY    . WISDOM TOOTH EXTRACTION       Current Outpatient Medications:  .  ARIPiprazole (ABILIFY) 10 MG tablet, Take 10 mg by mouth daily.,  Disp: , Rfl:  .  buPROPion (WELLBUTRIN) 75 MG tablet, Take 150 mg by mouth 2 (two) times daily. , Disp: , Rfl:  .  cetirizine (ZYRTEC) 10 MG tablet, Take 10 mg by mouth daily., Disp: , Rfl:  .  Budesonide (RHINOCORT ALLERGY NA), Place 1 Dose into the nose daily. Reported on 12/28/2015, Disp: , Rfl:  .  Omega-3 Fatty Acids (OMEGA 3 PO), Take 1 capsule by mouth daily. Reported on 12/28/2015, Disp: , Rfl:  .  Prenatal Vit-Fe Fumarate-FA (PRENATAL MULTIVITAMIN) TABS tablet, Take 1 tablet by mouth daily at 12 noon. Reported on 12/28/2015, Disp: , Rfl:  .  prenatal vitamin w/FE, FA (PRENATAL 1 + 1) 27-1 MG TABS tablet, Take 1 tablet by mouth daily before breakfast. (Patient not taking: Reported on 01/14/2018), Disp: 30 tablet, Rfl: 11 .  Prenatal-DSS-FeCb-FeGl-FA (CITRANATAL BLOOM) 90-1 MG TABS, Take 1 tablet by mouth daily before breakfast. (Patient not taking: Reported on 01/14/2018), Disp: 30 tablet, Rfl: 11 .  Prenatal-DSS-FeCb-FeGl-FA (CITRANATAL BLOOM) 90-1 MG TABS, Take 1 tablet by mouth daily before breakfast., Disp: 30 tablet, Rfl: 11 .  senna-docusate (SENOKOT-S) 8.6-50 MG tablet, Take 2 tablets by mouth at bedtime as needed for mild constipation., Disp: 90 tablet, Rfl: 1 Allergies  Allergen Reactions  . Azithromycin Nausea And Vomiting    Social History   Tobacco Use  .  Smoking status: Never Smoker  . Smokeless tobacco: Never Used  Substance Use Topics  . Alcohol use: No    Alcohol/week: 0.0 standard drinks    History reviewed. No pertinent family history.    Review of Systems  Constitutional: negative for fatigue and weight loss Respiratory: negative for cough and wheezing Cardiovascular: negative for chest pain, fatigue and palpitations Gastrointestinal: positive for chronic constipation.  Negative for abdominal pain Musculoskeletal:negative for myalgias Neurological: negative for gait problems and tremors Behavioral/Psych: negative for abusive relationship, depression Endocrine:  negative for temperature intolerance    Genitourinary:negative for abnormal menstrual periods, genital lesions, hot flashes, sexual problems and vaginal discharge Integument/breast: negative for breast lump, breast tenderness, nipple discharge and skin lesion(s)    Objective:       BP 90/63   Pulse 82   Ht 5\' 7"  (1.702 m)   Wt 97 lb (44 kg)   LMP 04/13/2020   BMI 15.19 kg/m  General:   alert and no distress  Skin:   no rash or abnormalities  Lungs:   clear to auscultation bilaterally  Heart:   regular rate and rhythm, S1, S2 normal, no murmur, click, rub or gallop  Breasts:   normal without suspicious masses, skin or nipple changes or axillary nodes  Abdomen:  normal findings: no organomegaly, soft, non-tender and no hernia  Pelvis:  External genitalia: normal general appearance Urinary system: urethral meatus normal and bladder without fullness, nontender Vaginal: normal without tenderness, induration or masses Cervix: normal appearance Adnexa: normal bimanual exam Uterus: anteverted and non-tender, normal size   Lab Review Urine pregnancy test Labs reviewed yes Radiologic studies reviewed no  50% of 20 min visit spent on counseling and coordination of care.   Assessment:     1. Encounter for gynecological examination with Papanicolaou smear of cervix Rx: - Cytology - PAP( [facility])  2. Constipation, unspecified constipation type - chronic problem Rx: - Prenatal-DSS-FeCb-FeGl-FA (CITRANATAL BLOOM) 90-1 MG TABS; Take 1 tablet by mouth daily before breakfast.  Dispense: 30 tablet; Refill: 11 - Ambulatory referral to Gastroenterology  3. Screening breast examination Rx: - MM Digital Screening; Future   Plan:    Education reviewed: calcium supplements, depression evaluation, low fat, low cholesterol diet, safe sex/STD prevention, self breast exams and weight bearing exercise. Mammogram ordered. Follow up in: 1 year.   Meds ordered this encounter  Medications   . Prenatal-DSS-FeCb-FeGl-FA (CITRANATAL BLOOM) 90-1 MG TABS    Sig: Take 1 tablet by mouth daily before breakfast.    Dispense:  30 tablet    Refill:  11   Orders Placed This Encounter  Procedures  . MM Digital Screening    Standing Status:   Future    Standing Expiration Date:   05/05/2021    Order Specific Question:   Reason for Exam (SYMPTOM  OR DIAGNOSIS REQUIRED)    Answer:   Screening    Order Specific Question:   Is the patient pregnant?    Answer:   No    Order Specific Question:   Preferred imaging location?    Answer:   [facility]  . Ambulatory referral to Gastroenterology    Referral Priority:   Routine    Referral Type:   Consultation    Referral Reason:   Specialty Services Required    Number of Visits Requested:   1    [facility], MD 05/05/2020 4:07 PM

## 2020-05-05 NOTE — Progress Notes (Signed)
Pt states she is having heavy cycles, lasting about 3 days.  Pt states cycles have been heavier for about the last year.  Pt complains of constipation.   Pt needs mammogram.

## 2020-05-06 LAB — CYTOLOGY - PAP
Comment: NEGATIVE
Diagnosis: NEGATIVE
High risk HPV: NEGATIVE

## 2020-05-12 ENCOUNTER — Encounter: Payer: Self-pay | Admitting: Gastroenterology

## 2020-07-12 ENCOUNTER — Ambulatory Visit: Payer: 59 | Admitting: Gastroenterology

## 2020-10-30 HISTORY — PX: RHINOPLASTY: SUR1284

## 2021-11-22 DIAGNOSIS — R319 Hematuria, unspecified: Secondary | ICD-10-CM | POA: Diagnosis not present

## 2021-11-22 DIAGNOSIS — R1030 Lower abdominal pain, unspecified: Secondary | ICD-10-CM | POA: Diagnosis not present

## 2021-12-19 DIAGNOSIS — R310 Gross hematuria: Secondary | ICD-10-CM | POA: Diagnosis not present

## 2021-12-23 DIAGNOSIS — F5101 Primary insomnia: Secondary | ICD-10-CM | POA: Diagnosis not present

## 2021-12-23 DIAGNOSIS — F330 Major depressive disorder, recurrent, mild: Secondary | ICD-10-CM | POA: Diagnosis not present

## 2022-04-13 ENCOUNTER — Encounter: Payer: Medicaid Other | Admitting: Plastic Surgery

## 2022-09-28 DIAGNOSIS — F330 Major depressive disorder, recurrent, mild: Secondary | ICD-10-CM | POA: Diagnosis not present

## 2022-09-28 DIAGNOSIS — F5101 Primary insomnia: Secondary | ICD-10-CM | POA: Diagnosis not present

## 2022-12-07 DIAGNOSIS — J069 Acute upper respiratory infection, unspecified: Secondary | ICD-10-CM | POA: Diagnosis not present

## 2023-02-27 DIAGNOSIS — Z1231 Encounter for screening mammogram for malignant neoplasm of breast: Secondary | ICD-10-CM | POA: Diagnosis not present

## 2023-02-27 LAB — HM MAMMOGRAPHY

## 2023-04-05 ENCOUNTER — Ambulatory Visit (INDEPENDENT_AMBULATORY_CARE_PROVIDER_SITE_OTHER): Payer: Medicaid Other | Admitting: Obstetrics

## 2023-04-05 ENCOUNTER — Encounter: Payer: Self-pay | Admitting: Obstetrics

## 2023-04-05 ENCOUNTER — Other Ambulatory Visit (HOSPITAL_COMMUNITY)
Admission: RE | Admit: 2023-04-05 | Discharge: 2023-04-05 | Disposition: A | Discharge status: Home / Self Care | Payer: Medicaid Other | Source: Ambulatory Visit | Attending: Obstetrics | Admitting: Obstetrics

## 2023-04-05 VITALS — BP 105/69 | HR 78 | Ht 67.0 in | Wt 96.8 lb

## 2023-04-05 DIAGNOSIS — Z01419 Encounter for gynecological examination (general) (routine) without abnormal findings: Secondary | ICD-10-CM | POA: Insufficient documentation

## 2023-04-05 NOTE — Progress Notes (Signed)
Subjective:        Alison Greer is a 47 y.o. female here for a routine exam.  Current complaints: None.    Personal health questionnaire:  Is patient Ashkenazi Jewish, have a family history of breast and/or ovarian cancer: no Is there a family history of uterine cancer diagnosed at age < 62, gastrointestinal cancer, urinary tract cancer, family member who is a Personnel officer syndrome-associated carrier: no Is the patient overweight and hypertensive, family history of diabetes, personal history of gestational diabetes, preeclampsia or PCOS: no Is patient over 96, have PCOS,  family history of premature CHD under age 67, diabetes, smoke, have hypertension or peripheral artery disease:  no At any time, has a partner hit, kicked or otherwise hurt or frightened you?: no Over the past 2 weeks, have you felt down, depressed or hopeless?: no Over the past 2 weeks, have you felt little interest or pleasure in doing things?:no   Gynecologic History No LMP recorded. (Menstrual status: Other). Contraception: vasectomy Last Pap: 2021. Results were: normal Last mammogram: 2024. Results were: normal  Obstetric History OB History  Gravida Para Term Preterm AB Living  3 3 3     3   SAB IAB Ectopic Multiple Live Births        0 3    # Outcome Date GA Lbr Len/2nd Weight Sex Delivery Anes PTL Lv  3 Term 11/17/15 [redacted]w[redacted]d 01:06 / 00:28 5 lb 8.7 oz (2.515 kg) F Vag-Spont EPI  LIV  2 Term 05/13/10 [redacted]w[redacted]d  6 lb 4 oz (2.835 kg) M Vag-Spont EPI N LIV     Complications: Umbilical cord complication during labor and delivery  1 Term 06/26/05 [redacted]w[redacted]d  6 lb 3 oz (2.807 kg) M Vag-Vacuum EPI N LIV    Past Medical History:  Diagnosis Date   Anemia     Past Surgical History:  Procedure Laterality Date   BREAST SURGERY     RHINOPLASTY  2022   WISDOM TOOTH EXTRACTION       Current Outpatient Medications:    ARIPiprazole (ABILIFY) 10 MG tablet, Take 10 mg by mouth daily., Disp: , Rfl:    Budesonide (RHINOCORT  ALLERGY NA), Place 1 Dose into the nose daily. Reported on 12/28/2015, Disp: , Rfl:    buPROPion (WELLBUTRIN) 75 MG tablet, Take 150 mg by mouth 2 (two) times daily. , Disp: , Rfl:    cetirizine (ZYRTEC) 10 MG tablet, Take 10 mg by mouth daily., Disp: , Rfl:    Omega-3 Fatty Acids (OMEGA 3 PO), Take 1 capsule by mouth daily. Reported on 12/28/2015, Disp: , Rfl:    Prenatal Vit-Fe Fumarate-FA (PRENATAL MULTIVITAMIN) TABS tablet, Take 1 tablet by mouth daily at 12 noon. Reported on 12/28/2015 (Patient not taking: Reported on 04/05/2023), Disp: , Rfl:    prenatal vitamin w/FE, FA (PRENATAL 1 + 1) 27-1 MG TABS tablet, Take 1 tablet by mouth daily before breakfast. (Patient not taking: Reported on 01/14/2018), Disp: 30 tablet, Rfl: 11   Prenatal-DSS-FeCb-FeGl-FA (CITRANATAL BLOOM) 90-1 MG TABS, Take 1 tablet by mouth daily before breakfast. (Patient not taking: Reported on 01/14/2018), Disp: 30 tablet, Rfl: 11   Prenatal-DSS-FeCb-FeGl-FA (CITRANATAL BLOOM) 90-1 MG TABS, Take 1 tablet by mouth daily before breakfast. (Patient not taking: Reported on 04/05/2023), Disp: 30 tablet, Rfl: 11   senna-docusate (SENOKOT-S) 8.6-50 MG tablet, Take 2 tablets by mouth at bedtime as needed for mild constipation. (Patient not taking: Reported on 04/05/2023), Disp: 90 tablet, Rfl: 1 Allergies  Allergen Reactions  Azithromycin Nausea And Vomiting    Social History   Tobacco Use   Smoking status: Never   Smokeless tobacco: Never  Substance Use Topics   Alcohol use: No    Alcohol/week: 0.0 standard drinks of alcohol    Family History  Problem Relation Age of Onset   Skin cancer Father    Skin cancer Maternal Grandmother    Cancer Paternal Grandfather       Review of Systems  Constitutional: negative for fatigue and weight loss Respiratory: negative for cough and wheezing Cardiovascular: negative for chest pain, fatigue and palpitations Gastrointestinal: negative for abdominal pain and change in bowel  habits Musculoskeletal:negative for myalgias Neurological: negative for gait problems and tremors Behavioral/Psych: negative for abusive relationship, depression Endocrine: negative for temperature intolerance    Genitourinary:negative for abnormal menstrual periods, genital lesions, hot flashes, sexual problems and vaginal discharge Integument/breast: negative for breast lump, breast tenderness, nipple discharge and skin lesion(s)    Objective:       BP 105/69   Pulse 78   Ht 5\' 7"  (1.702 m)   Wt 96 lb 12.8 oz (43.9 kg)   BMI 15.16 kg/m  General:   alert  Skin:   no rash or abnormalities  Lungs:   clear to auscultation bilaterally  Heart:   regular rate and rhythm, S1, S2 normal, no murmur, click, rub or gallop  Breasts:   normal without suspicious masses, skin or nipple changes or axillary nodes  Abdomen:  normal findings: no organomegaly, soft, non-tender and no hernia  Pelvis:  External genitalia: normal general appearance Urinary system: urethral meatus normal and bladder without fullness, nontender Vaginal: normal without tenderness, induration or masses Cervix: normal appearance Adnexa: normal bimanual exam Uterus: anteverted and non-tender, normal size   Lab Review Urine pregnancy test Labs reviewed yes Radiologic studies reviewed yes  I have spent a total of 20 minutes of face-to-face time, excluding clinical staff time, reviewing notes and preparing to see patient, ordering tests and/or medications, and counseling the patient.   Assessment:    1. Encounter for gynecological examination with Papanicolaou smear of cervix Rx: - Cytology - PAP    Plan:    Education reviewed: calcium supplements, depression evaluation, low fat, low cholesterol diet, safe sex/STD prevention, self breast exams, and weight bearing exercise. Follow up in: 1 year.    Brock Bad, MD 04/05/2023 3:02 PM

## 2023-04-05 NOTE — Progress Notes (Signed)
Pt is in the office for annual Last pap 05/05/2020 Pt reports last cycle was several years ago. Pt reports last mammogram was last month at [facility], pt reports normal results.

## 2023-04-09 LAB — CYTOLOGY - PAP
Comment: NEGATIVE
Diagnosis: NEGATIVE
High risk HPV: NEGATIVE

## 2023-04-12 ENCOUNTER — Ambulatory Visit (INDEPENDENT_AMBULATORY_CARE_PROVIDER_SITE_OTHER): Payer: Self-pay | Admitting: Licensed Clinical Social Worker

## 2023-04-12 DIAGNOSIS — F39 Unspecified mood [affective] disorder: Secondary | ICD-10-CM

## 2023-04-16 NOTE — BH Specialist Note (Signed)
Integrated Behavioral Health via Telemedicine Visit  04/16/2023 JARIANNA HULTS [id number]  Number of Integrated Behavioral Health Clinician visits: 1 Session Start time:  202pm Session End time: 250pm Total time in minutes: 48 mins via phone   Referring Provider: Dr. Clearance Coots  Patient/Family location: Home  [facility] Provider location: [facility]  All persons participating in visit: Pt Alison Greer and LCSW  A Drayk Humbarger  Types of Service: Individual psychotherapy and Telephone visit  I connected with Leveda Anna and/or Stefan Church n/a via  Telephone or Video Enabled Telemedicine Application  (Video is Caregility application) and verified that I am speaking with the correct person using two identifiers. Discussed confidentiality: Yes   I discussed the limitations of telemedicine and the availability of in person appointments.  Discussed there is a possibility of technology failure and discussed alternative modes of communication if that failure occurs.  I discussed that engaging in this telemedicine visit, they consent to the provision of behavioral healthcare and the services will be billed under their insurance.  Patient and/or legal guardian expressed understanding and consented to Telemedicine visit: Yes   Presenting Concerns: Patient and/or family reports the following symptoms/concerns: mood disorder  Duration of problem: over one year; Severity of problem: mild  Patient and/or Family's Strengths/Protective Factors: Concrete supports in place (healthy food, safe environments, etc.)  Goals Addressed: Patient will:  Reduce symptoms of: mood instability   Increase knowledge and/or ability of: coping skills   Demonstrate ability to: Increase healthy adjustment to current life circumstances  Progress towards Goals: Ongoing  Interventions: Interventions utilized:  Supportive Counseling Standardized Assessments completed: PHQ 9  Patient and/or Family Response: Ms. Kiester reports  changes in mood and feeling overwhelmed   Assessment: Patient currently experiencing mood disorder .   Patient may benefit from integrated behavioral health.  Plan: Follow up with behavioral health clinician on : as needed ms.  Referral(s): Integrated [employer] (In Clinic)  I discussed the assessment and treatment plan with the patient and/or parent/guardian. They were provided an opportunity to ask questions and all were answered. They agreed with the plan and demonstrated an understanding of the instructions.   They were advised to call back or seek an in-person evaluation if the symptoms worsen or if the condition fails to improve as anticipated.  Gwyndolyn Saxon, LCSW

## 2023-05-01 DIAGNOSIS — R5382 Chronic fatigue, unspecified: Secondary | ICD-10-CM | POA: Diagnosis not present

## 2023-06-19 ENCOUNTER — Ambulatory Visit (INDEPENDENT_AMBULATORY_CARE_PROVIDER_SITE_OTHER): Payer: Self-pay | Admitting: Plastic Surgery

## 2023-06-19 ENCOUNTER — Encounter: Payer: Self-pay | Admitting: Plastic Surgery

## 2023-06-19 VITALS — BP 137/115 | HR 75 | Ht 67.0 in | Wt 97.4 lb

## 2023-06-19 DIAGNOSIS — Z719 Counseling, unspecified: Secondary | ICD-10-CM

## 2023-06-19 NOTE — Progress Notes (Signed)
Patient ID: Alison Greer, female    DOB: [birth year], 47 y.o.   MRN: [id number]   Chief Complaint  Patient presents with   Advice Only    The patient is a 47 year old female here for evaluation of her breasts.  She underwent implant placement over 13 years ago with Dr. Stephens November.  She denies current smoking.  She does not have diabetes.  Her mammogram is up-to-date from this year was done at [facility].  She thinks that the implants are silicone and might be around 1 to 200 cc.  She thinks that they may be different from left to right.  She feels like they are very pointy and too big for her frame.  She is very petite and I agree and the implants look too narrow for her chest as well.  I do not feel any discerning lumps or bumps.  I do think she is got some capsular contracture at least on the left.  She is concerned that if she has the implants out she will be completely flat and that is probably true.  He is 5 feet 7 inches tall and weighs 97 pounds.    Review of Systems  Constitutional: Negative.   HENT: Negative.    Eyes: Negative.   Respiratory: Negative.    Cardiovascular: Negative.   Gastrointestinal: Negative.   Endocrine: Negative.   Genitourinary: Negative.   Musculoskeletal: Negative.     Past Medical History:  Diagnosis Date   Anemia     Past Surgical History:  Procedure Laterality Date   BREAST SURGERY     RHINOPLASTY  2022   WISDOM TOOTH EXTRACTION        Current Outpatient Medications:    ARIPiprazole (ABILIFY) 2 MG tablet, Take 2 mg by mouth daily., Disp: , Rfl:    Budesonide (RHINOCORT ALLERGY NA), Place 1 Dose into the nose daily. Reported on 12/28/2015, Disp: , Rfl:    buPROPion (WELLBUTRIN) 75 MG tablet, Take 150 mg by mouth 2 (two) times daily. , Disp: , Rfl:    cetirizine (ZYRTEC) 10 MG tablet, Take 10 mg by mouth daily., Disp: , Rfl:    Omega-3 Fatty Acids (OMEGA 3 PO), Take 1 capsule by mouth daily. Reported on 12/28/2015, Disp: , Rfl:    Objective:    Vitals:   06/19/23 1452  BP: (!) 137/115  Pulse: 75  SpO2: 98%    Physical Exam Vitals and nursing note reviewed.  Constitutional:      Appearance: Normal appearance.  HENT:     Head: Atraumatic.  Cardiovascular:     Rate and Rhythm: Normal rate.     Pulses: Normal pulses.  Pulmonary:     Effort: Pulmonary effort is normal.  Abdominal:     Palpations: Abdomen is soft.  Musculoskeletal:        General: No swelling or deformity.  Skin:    General: Skin is warm.     Capillary Refill: Capillary refill takes less than 2 seconds.  Neurological:     Mental Status: She is alert and oriented to person, place, and time.  Psychiatric:        Mood and Affect: Mood normal.        Behavior: Behavior normal.        Thought Content: Thought content normal.        Judgment: Judgment normal.     Assessment & Plan:  No diagnosis found.  I am happy to send her a quote and  she is going to call and let us know what size she has and so we can talk about her options for maybe a better fitting implant.  Alena Bills Kharter Brew, DO

## 2023-06-20 ENCOUNTER — Telehealth: Payer: Self-pay | Admitting: Plastic Surgery

## 2023-06-20 ENCOUNTER — Telehealth: Payer: Self-pay

## 2023-06-20 ENCOUNTER — Encounter: Payer: Self-pay | Admitting: Plastic Surgery

## 2023-06-20 NOTE — Telephone Encounter (Signed)
Pt called and wanted to let Dr Ulice Bold her R implant 190 ML, L implant is 170 ML, and wants to know when she will get her quote for sx. Thank you

## 2023-06-20 NOTE — Telephone Encounter (Signed)
Faxed release of information request to [facility]. Received fax success confirmation. Forwarded document to front desk for batch scanning.

## 2023-06-21 ENCOUNTER — Encounter: Payer: Self-pay | Admitting: Physician Assistant

## 2023-06-21 NOTE — Telephone Encounter (Signed)
Yes, will do

## 2023-07-16 DIAGNOSIS — F331 Major depressive disorder, recurrent, moderate: Secondary | ICD-10-CM | POA: Diagnosis not present

## 2023-07-16 DIAGNOSIS — F5101 Primary insomnia: Secondary | ICD-10-CM | POA: Diagnosis not present

## 2024-03-15 NOTE — ED Triage Notes (Signed)
 Pt arrives via EMS from home with complaints of abdominal pain. Pt also complaining of bilateral shoulder pain. Pt denies any vomiting or diarrhea, has recently started taking iron supplements.

## 2024-03-15 NOTE — Care Plan (Signed)
 Upper Level Admission Note For full plan please see intern H&P  History of Present Illness: Riot Barrick is a 48yoF without significant past medical history who presents due to abdominal pain.  Patient presented due to 1 day history of acute progressive abdominal pain.  Patient reports for the last 1 to 2 weeks she has been taking iron pills which is associated with post consumption abdominal pain.  She denies any dysphagia or globus sensation that does have associated with vomiting.  She denies abdominal pain prior to initiating iron supplements.  She reports taking Excedrin 2 pills every day.  Denies any melena or medic easier.  On evaluation white blood cell 7.9, hemoglobin 11, bicarb 23.  ASSESSMENT/PLAN: Averil Digman is a 48yoF without significant past medical history who presents due to abdominal pain.   #Gastric perforation CT evidence of gastric perforation, no prior occurrence. Hemodynamically stable on presentation.  Driving etiology likely peptic ulcer disease related to NSAID use and possibly H. pylori.  However strange that she has not had a history of ongoing abdominal pain of a developing ulcer Plan  - H pylori, BMP + lactic acid q4h until surgery - continue zosyn - surgery aware and to intervene this morning. NPO - IV PPI twice daily - Start aggressive maintenance fluids, bolus if any decline in BP  Norleen Schaumann, MD Internal Medicine, PGY-3 [phone]

## 2024-03-15 NOTE — ED Provider Notes (Signed)
 Emergency Department Provider Note   History   Chief Complaint  Patient presents with  . Abdominal Pain      History provided by:  Patient Abdominal Pain Pain location:  Generalized Pain quality: aching and bloating   Pain radiates to:  Does not radiate Pain severity:  Moderate Onset quality:  Gradual Duration:  2 days Timing:  Constant Progression:  Unchanged Chronicity:  New Context comment:  Recently started iron for anemia Relieved by:  None tried Worsened by:  Nothing Ineffective treatments:  None tried    Past Medical History No past medical history on file.   Past Surgical History No past surgical history on file.    Medications Current Outpatient Medications  Medication Instructions  . ARIPiprazole (ABILIFY) 5 mg, oral, Daily  . buPROPion (WELLBUTRIN XL) 150 mg 24 hr tablet oral, Daily 0800  . Caplyta 42 mg, Nightly  . cetirizine (ZYRTEC) 10 mg, oral, Daily  . clonazePAM (KlonoPIN) 0.5 mg tablet 1 tablet, Daily  . zolpidem  (AMBIEN ) 10 mg, At  bedtime PRN      Allergies Allergies  Allergen Reactions  . Azithromycin Nausea And Vomiting  . Prenatal Multivit-Min-Fe-Fa Other (See Comments)  . Erythromycin Nausea And Vomiting     Family History No family history on file.   Social History Social History   Socioeconomic History  . Marital status: Single    Spouse name: Not on file  . Number of children: Not on file  . Years of education: Not on file  . Highest education level: Not on file  Occupational History  . Not on file  Tobacco Use  . Smoking status: Never  . Smokeless tobacco: Never  Substance and Sexual Activity  . Alcohol use: Not Currently  . Drug use: Never  . Sexual activity: Not on file  Other Topics Concern  . Not on file  Social History Narrative  . Not on file   Social Drivers of Health   Food Insecurity: Not on file  Transportation Needs: Not on file  Safety: Not on file  Living Situation: Not on file       Review of Systems  Review of Systems  Gastrointestinal:  Positive for abdominal pain.     Physical Exam   ED Triage Vitals [03/15/24 0209]  Temp 97.8 F (36.6 C)  Heart Rate 66  Resp 19  BP 134/77  MAP (mmHg) 91  SpO2 98 %  O2 Device None (Room air)  O2 Flow Rate (L/min)   Weight 39.7 kg (87 lb 8 oz)    Physical Exam Constitutional:      General: She is not in acute distress.    Appearance: Normal appearance.  HENT:     Head: Atraumatic.     Mouth/Throat:     Mouth: Mucous membranes are moist.  Eyes:     Extraocular Movements: Extraocular movements intact.  Cardiovascular:     Rate and Rhythm: Normal rate and regular rhythm.  Pulmonary:     Effort: Pulmonary effort is normal. No respiratory distress.  Abdominal:     General: Abdomen is flat. There is distension.     Palpations: Abdomen is soft.     Tenderness: There is no abdominal tenderness. There is no guarding or rebound.     Comments: Mild diffuse lower abdominal tenderness  Musculoskeletal:        General: No deformity.     Cervical back: Normal range of motion.  Skin:    General: Skin is warm and  dry.  Neurological:     Mental Status: She is alert and oriented to person, place, and time. Mental status is at baseline.  Psychiatric:        Mood and Affect: Mood normal.     Labs   Abnormal Labs Reviewed  COMPREHENSIVE METABOLIC PANEL - Abnormal; Notable for the following components:      Result Value   Sodium 133 (*)    Glucose, Random 136 (*)    Creatinine 0.42 (*)    Albumin 3.4 (*)    Total Protein 5.7 (*)    Calcium 8.3 (*)    BUN/Creatinine Ratio 26.2 (*)    All other components within normal limits  CBC WITH DIFFERENTIAL - Abnormal; Notable for the following components:   RBC 3.72 (*)    Hemoglobin 11.2 (*)    Hematocrit 33.1 (*)    Lymphocytes # 0.30 (*)    All other components within normal limits    Labs independently reviewed by myself and considered in medical decision  making.  Radiology   CT Abdomen Pelvis W Contrast  Final Result by Delmar Herbert Winfred Booker Results In [place] [id number] (05/17 0510)  CLINICAL DATA:  Abdominal pain.    EXAM:  CT ABDOMEN AND PELVIS WITH CONTRAST    TECHNIQUE:  Multidetector CT imaging of the abdomen and pelvis was performed  using the standard protocol following bolus administration of  intravenous contrast.    RADIATION DOSE REDUCTION: This exam was performed according to the  departmental dose-optimization program which includes automated  exposure control, adjustment of the mA and/or kV according to  patient size and/or use of iterative reconstruction technique.    CONTRAST:  80 mL iohexoL (OMNIPAQUE) 350 mg iodine/mL injection  (MDV) 80 mL    COMPARISON:  None Available.    FINDINGS:  Lower chest: Lung bases show posterior atelectasis and mild chronic  change without acute infiltrate.    The cardiac size is normal. There is a small anterior pericardial  effusion. There are bilateral breast implants.    Hepatobiliary: The liver is mildly steatotic, including focal  periligamentous fat in segment 4 B. There is no mass enhancement    There is a partially rim calcified ovoid cystic structure in between  the posterolateral lower thoracoabdominal wall and hepatic segment  7, smoothly deforming the liver surface and measuring 2 x 1.1 x 2.1  cm and 29 Hounsfield units.    This is probably a partially calcified mesenteric cyst or an old  posttraumatic collection.    I am not entirely certain this is not subcapsular in location, but  in any case a benign process is favored.    The gallbladder and bile ducts are unremarkable.    Pancreas: No abnormality.    Spleen: No abnormality.  No splenomegaly.    Adrenals/Urinary Tract: No abnormality.    Stomach/Bowel: In the anterior midline on 2:40 there is a  full-thickness tear in the anterior gastric antrum with thickened  wall on either side of the tear, most likely a  perforated peptic  ulcer, spilling gastric contents into the abdominal cavity.    There is a large amount of free air in the anterior abdomen and  pelvis, free fluid collecting around the right lobe of the liver,  small to moderate amount tracking into the pelvis and there is  slight enhancement of the peritoneal reflections along the right  pericolic gutter consistent with peritonitis.    The unopacified small bowel is normal in  caliber. The appendix is  not seen and could be absent or obscured by overlapping structures.    There is a thickened ascending colon wall which could be reactive or  due to coexisting colitis.    Rest of the colon wall is normal thickness. There is sigmoid  diverticulosis without diverticulitis.    Vascular/Lymphatic: There is moderate left pelvic venous congestion.  No other significant vascular findings. No adenopathy is seen.    Reproductive: Uterus and bilateral adnexa are unremarkable.    Other: None    Musculoskeletal: Mild hip DJD. No acute or other significant osseous  findings.    IMPRESSION:  1. Large amount of free air in the anterior abdomen and pelvis, with  free fluid collecting around the right lobe of the liver, small to  moderate amount tracking into the pelvis, and slight enhancement of  the peritoneal reflections consistent with peritonitis.  2. Full-thickness tear in the anterior gastric antrum with thickened  wall on either side of the tear, most likely a perforated peptic  ulcer.  3. Ascending colon wall thickening which could be reactive or due to  coexisting colitis.  4. Sigmoid diverticulosis without evidence of diverticulitis.  5. Mild hepatic steatosis.  6. 2 x 1.1 x 2.1 cm partially rim calcified cystic structure in  between the posterolateral lower thoracoabdominal wall and hepatic  segment 7, probably a partially calcified mesenteric cyst or an old  posttraumatic collection. I am not entirely certain this is not   subcapsular in location, but in any case a benign process is  favored.  7. Small anterior pericardial effusion.  8. Moderate left pelvic venous congestion.  9. Critical Value/emergent results were called by telephone at the  time of interpretation on 03/15/2024 at 4:53 am to provider NORLEEN ARTS II , who verbally acknowledged these results.      Electronically Signed    By: Francis Quam M.D.    On: 03/15/2024 05:10        Imaging independently reviewed by myself and considered in medical decision making. Imaging final read interpreted by radiology.  Procedure Note  Critical Care  Performed by: Norleen Ozell Arts PONCE, MD Authorized by: Norleen Ozell Arts PONCE, MD   Critical care provider statement:    Critical care time (minutes):  35   Critical care time was exclusive of:  Separately billable procedures and treating other patients   Critical care was necessary to treat or prevent imminent or life-threatening deterioration of the following conditions: perforated stomach ulcer, pneumoperitoneum.   Critical care was time spent personally by me on the following activities:  Development of treatment plan with patient or surrogate, discussions with consultants, evaluation of patient's response to treatment, examination of patient, obtaining history from patient or surrogate, pulse oximetry, re-evaluation of patient's condition, review of old charts, ordering and performing treatments and interventions, ordering and review of laboratory studies and ordering and review of radiographic studies   Care discussed with: admitting provider     Medical Decision Making   Medical Decision Making Patient is a very pleasant 48 year old female with history of iron deficiency anemia presents for evaluation of abdominal pain.  The pain started yesterday.  On exam, she has diffuse abdominal tenderness.  No rebound or guarding.  The tenderness is most prominent in the lower abdomen.  Differential  diagnosis includes appendicitis, diverticulitis, bowel perforation, bowel obstruction.  Labs are overall reassuring with normal white blood cell count.  Mild anemia noted.  CT scan ordered  which is concerning for large amount of free air in the anterior abdomen and pelvis with free fluid as well.  This is most likely coming from a perforated stomach ulcer.  As soon as I saw the CT scan results I consulted surgery emergently as well as ordered IV Zosyn.  I spoke with Dr. Kathryne with general surgery who is on his way into see the patient and likely take her to the OR.  Hospitalist consulted for admission.  Problems Addressed: Perforated ulcer    (CMD): complicated acute illness or injury Pneumoperitoneum: complicated acute illness or injury  Amount and/or Complexity of Data Reviewed Labs: ordered. Radiology: ordered.  Risk Prescription drug management. Decision regarding hospitalization.     ED Clinical Impression   1. Pneumoperitoneum   2. Perforated ulcer    (CMD)      ED Disposition     ED Disposition  Admit   Condition  --   Comment  --           New Prescriptions   No medications on file      FOLLOW UP No follow-up provider specified.

## 2024-03-24 NOTE — Discharge Summary (Signed)
 ------------------------------------------------------------------------------- Attestation signed by Donnice Alm Camp, MD at 03/25/2024  2:08 PM I saw and evaluated the patient on day of discharge and reviewed the resident's note. I agree with the findings and plan.  Time spent on discharge: 17 minutes  Electronically signed by: Donnice Alm Camp, MD 03/25/2024 2:08 PM  -------------------------------------------------------------------------------  [facility] Discharge Summary  Patient ID: Alison Greer [id number] 48 y.o. November 11, 1975  Admit date: 03/15/2024 Admitting Physician: Raenelle Jody Blank, MD Admission Condition: stable Admission Diagnoses: Pneumoperitoneum  Discharge date: Mon 03/24/2024 Discharge Physician: Dr. Camp Discharged Condition: good Discharge Diagnoses: Principal Problem:   Pneumoperitoneum Active Problems:   Gastric perforation    (CMD) Resolved Problems:   * No resolved hospital problems. *  Procedures/Surgeries performed during hospitalization:  No admission procedures for hospital encounter.  Procedure(s) (LRB): DIAGNOSTIC EXPLORATION ROBOTIC XI, MODIFIED GRAHAM PATCH REPAIR (N/A)  Indication for Admission:  Complicated 48 year old female who presents with abdominal pain x 24 hours. She denies nausea or emesis. She has never had this pain before. No fevers. She uses daily Excedrin. Due to the progressive nature of her symptoms, she presented to the ED for further evaluation where cross sectional imaging demonstrated pneumoperitoneum. General surgery was consulted for further evaluation.   Hospital Course:  Alison Greer presented to the hospital with findings consistent with perforated viscous. She was taken to the operating room where they underwent the above mentioned procedure without complication. She did well postoperatively and was monitored in a floor bed. They remained hemodynamically stable and had no issues with  post-op nausea/vomiting. Her diet was slowly advanced to regular without incident. Pain was controlled on oral pain medications. They are ambulating independently, without dizziness or shortness or breath. The patient has met all goals necessary for discharge from a medical and surgical standpoint and is considered stable and suitable for discharge. Discharge medications were discussed in detail and the patient stated understanding of use and administration. Instructions for appropriate wound care were also discussed. Her drain was removed. The patient verbalized understanding of all discharge instructions and therefore was released.   Discharge Exam: GENERAL: No acute distress, alert and oriented   HEENT: Normocephalic, atraumatic, sclerae anicteric, trachea midline  CARDIOVASCULAR: Regular rate, hemodynamically stable  PULMONARY: Normal work of breathing, good chest rise and fall ABDOMEN: Soft, non-tender, non-distended. No guarding or rebound tenderness. Incisions C/D/I with no surrounding erythema. Drain site c/d/I. EXTREMITIES: Warm well perfused, moving all extremities spontaneously SKIN: Warm, dry, intact  Disposition: Home  Patient Instructions: See attached  Discharge Medications:   Medication List     START taking these medications    acetaminophen  325 mg tablet Commonly known as: TYLENOL  Take 2 tablets (650 mg total) by mouth every 6 (six) hours as needed for mild pain (1-3) or moderate pain (4-6).   omeprazole 20 mg DR capsule Commonly known as: PriLOSEC Take 1 capsule (20 mg total) by mouth 2 (two) times a day.       CONTINUE taking these medications    ARIPiprazole 10 mg tablet Commonly known as: ABILIFY Take 5 mg by mouth Once Daily.   cetirizine 10 mg tablet Commonly known as: ZyrTEC Take 10 mg by mouth Once Daily.   clonazePAM 0.5 mg tablet Commonly known as: KlonoPIN Take 1 tablet by mouth daily.   gabapentin 300 mg capsule Commonly known as:  NEURONTIN Take 300 mg by mouth 3 (three) times a day.   zolpidem  10 mg tablet Commonly known as: AMBIEN  Take 10  mg by mouth nightly as needed.         Where to Get Your Medications     These medications were sent to [facility] [address] [place], [place] - [address] - PHONE: [phone] - FAX: [phone]  [address] [place] [address]    Phone: [phone]  omeprazole 20 mg DR capsule    You can get these medications from any pharmacy   You don't need a prescription for these medications acetaminophen  325 mg tablet     Discharge Instructions: The patient was instructed to call if they develop a fever greater than 101.5, any drainage from the incision, redness extending from his incision, unable to void, or any other questions or concerns.  The patient was instructed to follow-up in 3-4 weeks in EGS clinic.   Time spent on discharge: 36 minutes  Electronically signed by:  Evalene Earnie Drain, MD 03/24/2024 6:03 PM

## 2024-03-28 NOTE — Progress Notes (Signed)
 None needed

## 2024-04-01 NOTE — Progress Notes (Signed)
 [facility]GS Post-op Note   Name:  Alison Greer DOB:  January 23, 1976 MRN:  [id number]    S/P:  Robot-assisted laparoscopic Arlyss patch repair (03/15/24, Dr. Kathryne)    Interval History:  Ms. Alison Greer is her for f/u RAL Arlyss patch.  She has been doing well since her surgery; tolerating diet and good bowel function.     Physical exam:  Blood pressure 122/78, pulse 72, temperature 97.1 F (36.2 C), temperature source Temporal, height 1.702 m (5' 7), weight 39.7 kg (87 lb 8 oz), SpO2 100%.  General:  Cachectic, NAD Psych:  Calm, cooperative Neuro:  Grossly equal/intact (B); 6/5/4 HEENT:  Normocephalic, atraumatic CV:  RRR without MRGC  Pulm:  CTA(B) with normal respirations in RA  Abd: soft, hyperactive, NTNG. Incisions are healing well, but the lower right two incisions have both had some drainage. The wounds were cleaned and bacitracin applied and dry gauze placed.    Assessment/Plan: S/p RAL Graham patch - tolerating diet, continues PPI BID. Wound care instructions given. Patient is clear to go to [place] , she will callif she has any further concerns or questions. Otherwise, no need to return the clinic.

## 2024-05-06 NOTE — Telephone Encounter (Signed)
 Received fax from pharmacy.

## 2024-07-07 ENCOUNTER — Ambulatory Visit: Admitting: Obstetrics

## 2024-08-13 ENCOUNTER — Ambulatory Visit: Admitting: Obstetrics and Gynecology

## 2024-08-21 ENCOUNTER — Ambulatory Visit: Admitting: Obstetrics

## 2024-09-01 ENCOUNTER — Ambulatory Visit: Admitting: Obstetrics

## 2024-09-03 ENCOUNTER — Ambulatory Visit: Admitting: Obstetrics

## 2024-09-24 ENCOUNTER — Other Ambulatory Visit: Payer: Self-pay | Admitting: Plastic Surgery

## 2024-09-25 ENCOUNTER — Ambulatory Visit (HOSPITAL_COMMUNITY)
Admission: EM | Admit: 2024-09-25 | Discharge: 2024-09-25 | Disposition: A | Discharge status: Home / Self Care | Attending: Emergency Medicine | Admitting: Emergency Medicine

## 2024-09-25 ENCOUNTER — Emergency Department (HOSPITAL_COMMUNITY): Admitting: Anesthesiology

## 2024-09-25 ENCOUNTER — Encounter (HOSPITAL_COMMUNITY): Payer: Self-pay

## 2024-09-25 ENCOUNTER — Ambulatory Visit: Admit: 2024-09-25

## 2024-09-25 ENCOUNTER — Encounter (HOSPITAL_COMMUNITY)
Admission: EM | Disposition: A | Discharge status: Home / Self Care | Payer: Self-pay | Source: Home / Self Care | Attending: Emergency Medicine

## 2024-09-25 ENCOUNTER — Other Ambulatory Visit: Payer: Self-pay

## 2024-09-25 DIAGNOSIS — S2002XA Contusion of left breast, initial encounter: Secondary | ICD-10-CM

## 2024-09-25 DIAGNOSIS — Z9882 Breast implant status: Secondary | ICD-10-CM | POA: Diagnosis not present

## 2024-09-25 DIAGNOSIS — M96841 Postprocedural hematoma of a musculoskeletal structure following other procedure: Secondary | ICD-10-CM | POA: Diagnosis not present

## 2024-09-25 DIAGNOSIS — N644 Mastodynia: Secondary | ICD-10-CM | POA: Insufficient documentation

## 2024-09-25 HISTORY — PX: HEMATOMA EVACUATION: SHX5118

## 2024-09-25 SURGERY — EVACUATION HEMATOMA
Anesthesia: General | Site: Breast | Laterality: Left

## 2024-09-25 MED ORDER — ONDANSETRON HCL 4 MG/2ML IJ SOLN
INTRAMUSCULAR | Status: DC | PRN
  Administered 2024-09-25: 4 mg via INTRAVENOUS

## 2024-09-25 MED ORDER — OXYCODONE HCL 5 MG/5ML PO SOLN: 5.0000 mg | Freq: Once | ORAL | Status: AC | PRN

## 2024-09-25 MED ORDER — LACTATED RINGERS IV SOLN: INTRAVENOUS | Status: DC | PRN

## 2024-09-25 MED ORDER — PHENYLEPHRINE HCL-NACL 20-0.9 MG/250ML-% IV SOLN
INTRAVENOUS | Status: DC | PRN
Start: 2024-09-25 — End: 2024-09-25
  Administered 2024-09-25: 50 ug/min via INTRAVENOUS

## 2024-09-25 MED ORDER — FENTANYL CITRATE (PF) 100 MCG/2ML IJ SOLN
INTRAMUSCULAR | Status: AC
  Filled 2024-09-25: qty 2

## 2024-09-25 MED ORDER — ACETAMINOPHEN 10 MG/ML IV SOLN
INTRAVENOUS | Status: DC | PRN
  Administered 2024-09-25: 1000 mg via INTRAVENOUS

## 2024-09-25 MED ORDER — DROPERIDOL 2.5 MG/ML IJ SOLN: 0.6250 mg | Freq: Once | INTRAMUSCULAR | Status: DC | PRN

## 2024-09-25 MED ORDER — LIDOCAINE 2% (20 MG/ML) 5 ML SYRINGE
INTRAMUSCULAR | Status: DC | PRN
  Administered 2024-09-25: 40 mg via INTRAVENOUS

## 2024-09-25 MED ORDER — VASHE WOUND IRRIGATION OPTIME
TOPICAL | Status: DC | PRN
  Administered 2024-09-25: 34 [oz_av]

## 2024-09-25 MED ORDER — FENTANYL CITRATE (PF) 100 MCG/2ML IJ SOLN
25.0000 ug | INTRAMUSCULAR | Status: DC | PRN
  Administered 2024-09-25: 25 ug via INTRAVENOUS
  Administered 2024-09-25: 50 ug via INTRAVENOUS

## 2024-09-25 MED ORDER — DEXAMETHASONE SOD PHOSPHATE PF 10 MG/ML IJ SOLN
INTRAMUSCULAR | Status: DC | PRN
  Administered 2024-09-25: 10 mg via INTRAVENOUS

## 2024-09-25 MED ORDER — MIDAZOLAM HCL 2 MG/2ML IJ SOLN
INTRAMUSCULAR | Status: AC
  Filled 2024-09-25: qty 2

## 2024-09-25 MED ORDER — PHENYLEPHRINE 80 MCG/ML (10ML) SYRINGE FOR IV PUSH (FOR BLOOD PRESSURE SUPPORT)
PREFILLED_SYRINGE | INTRAVENOUS | Status: DC | PRN
  Administered 2024-09-25: 160 ug via INTRAVENOUS
  Administered 2024-09-25 (×2): 80 ug via INTRAVENOUS

## 2024-09-25 MED ORDER — FENTANYL CITRATE (PF) 250 MCG/5ML IJ SOLN
INTRAMUSCULAR | Status: DC | PRN
  Administered 2024-09-25 (×2): 50 ug via INTRAVENOUS

## 2024-09-25 MED ORDER — OXYCODONE HCL 5 MG PO TABS
ORAL_TABLET | ORAL | Status: AC
  Filled 2024-09-25: qty 1

## 2024-09-25 MED ORDER — PROPOFOL 10 MG/ML IV BOLUS
INTRAVENOUS | Status: DC | PRN
  Administered 2024-09-25: 140 mg via INTRAVENOUS

## 2024-09-25 MED ORDER — BUPIVACAINE-EPINEPHRINE 0.25% -1:200000 IJ SOLN
INTRAMUSCULAR | Status: DC | PRN
  Administered 2024-09-25: 20 mL

## 2024-09-25 MED ORDER — SCOPOLAMINE 1 MG/3DAYS TD PT72
MEDICATED_PATCH | TRANSDERMAL | Status: DC | PRN
  Administered 2024-09-25: 1 via TRANSDERMAL

## 2024-09-25 MED ORDER — IRRISEPT - 450ML BOTTLE WITH 0.05% CHG IN STERILE WATER, USP 99.95% OPTIME
TOPICAL | Status: DC | PRN
  Administered 2024-09-25: 450 mL

## 2024-09-25 MED ORDER — CEFAZOLIN SODIUM-DEXTROSE 2-3 GM-%(50ML) IV SOLR
INTRAVENOUS | Status: DC | PRN
  Administered 2024-09-25: 2 g via INTRAVENOUS

## 2024-09-25 MED ORDER — OXYCODONE HCL 5 MG PO TABS
5.0000 mg | ORAL_TABLET | Freq: Once | ORAL | Status: AC | PRN
  Administered 2024-09-25: 5 mg via ORAL

## 2024-09-25 MED ORDER — 0.9 % SODIUM CHLORIDE (POUR BTL) OPTIME
TOPICAL | Status: DC | PRN
  Administered 2024-09-25: 1000 mL

## 2024-09-25 MED ORDER — MIDAZOLAM HCL (PF) 2 MG/2ML IJ SOLN
INTRAMUSCULAR | Status: DC | PRN
  Administered 2024-09-25: 2 mg via INTRAVENOUS

## 2024-09-25 SURGICAL SUPPLY — 40 items
BENZOIN TINCTURE PRP APPL 2/3 (GAUZE/BANDAGES/DRESSINGS) ×2 IMPLANT
BIOPATCH RED 1 DISK 7.0 (GAUZE/BANDAGES/DRESSINGS) ×4 IMPLANT
BNDG ELASTIC 6INX 5YD STR LF (GAUZE/BANDAGES/DRESSINGS) ×4 IMPLANT
BNDG ELASTIC 6X10 VLCR STRL LF (GAUZE/BANDAGES/DRESSINGS) IMPLANT
CANISTER SUCT 1200ML W/VALVE (MISCELLANEOUS) ×2 IMPLANT
CLEANSER WND VASHE 34 (WOUND CARE) IMPLANT
CLSR STERI-STRIP ANTIMIC 1/2X4 (GAUZE/BANDAGES/DRESSINGS) IMPLANT
COVER SURGICAL LIGHT HANDLE (MISCELLANEOUS) ×2 IMPLANT
DERMABOND ADVANCED .7 DNX12 (GAUZE/BANDAGES/DRESSINGS) IMPLANT
DRAIN CHANNEL 15F RND FF W/TCR (WOUND CARE) IMPLANT
DRSG TEGADERM 4X4.5 CHG (GAUZE/BANDAGES/DRESSINGS) ×4 IMPLANT
DRSG TEGADERM 4X4.75 (GAUZE/BANDAGES/DRESSINGS) IMPLANT
ELECT CAUTERY BLADE 6.4 (BLADE) IMPLANT
ELECT COATED BLADE 2.86 ST (ELECTRODE) IMPLANT
ELECTRODE BLDE 4.0 EZ CLN MEGD (MISCELLANEOUS) IMPLANT
ELECTRODE REM PT RTRN 9FT ADLT (ELECTROSURGICAL) ×2 IMPLANT
EVACUATOR SILICONE 100CC (DRAIN) IMPLANT
FUNNEL KELLER 2 DISP (MISCELLANEOUS) IMPLANT
GAUZE PAD ABD 8X10 STRL (GAUZE/BANDAGES/DRESSINGS) IMPLANT
GAUZE SPONGE 4X4 12PLY STRL (GAUZE/BANDAGES/DRESSINGS) IMPLANT
GAUZE XEROFORM 1X8 LF (GAUZE/BANDAGES/DRESSINGS) IMPLANT
GLOVE SRG 8 PF TXTR STRL LF DI (GLOVE) ×2 IMPLANT
GOWN STRL REUS W/ TWL LRG LVL3 (GOWN DISPOSABLE) ×4 IMPLANT
LAVAGE JET IRRISEPT WOUND (IRRIGATION / IRRIGATOR) IMPLANT
LIGHT WAVEGUIDE WIDE FLAT (MISCELLANEOUS) IMPLANT
NDL HYPO 22X1.5 SAFETY MO (MISCELLANEOUS) ×2 IMPLANT
PACK GENERAL/GYN (CUSTOM PROCEDURE TRAY) ×2 IMPLANT
PAD ABD 8X10 STRL (GAUZE/BANDAGES/DRESSINGS) ×4 IMPLANT
PIN SAFETY STERILE (MISCELLANEOUS) IMPLANT
SOL PREP POV-IOD 4OZ 10% (MISCELLANEOUS) IMPLANT
SOLN 0.9% NACL POUR BTL 1000ML (IV SOLUTION) ×2 IMPLANT
SPONGE T-LAP 18X18 ~~LOC~~+RFID (SPONGE) IMPLANT
STAPLER SKIN PROX 35W (STAPLE) ×2 IMPLANT
STRIP CLOSURE SKIN 1/2X4 (GAUZE/BANDAGES/DRESSINGS) IMPLANT
SUT NYLON 3 0 (SUTURE) IMPLANT
SUT PDS AB 3-0 SH 27 (SUTURE) IMPLANT
SUTURE STRATFX MNCRL+ 3-0 PS-2 (SUTURE) IMPLANT
SYR 50ML LL SCALE MARK (SYRINGE) ×2 IMPLANT
SYR CONTROL 10ML LL (SYRINGE) ×2 IMPLANT
TOWEL GREEN STERILE FF (TOWEL DISPOSABLE) ×2 IMPLANT

## 2024-09-25 NOTE — Anesthesia Procedure Notes (Signed)
 Procedure Name: LMA Insertion Date/Time: 09/25/2024 4:03 PM  Performed by: Arvell Edsel HERO, CRNAPre-anesthesia Checklist: Patient identified, Emergency Drugs available, Suction available and Patient being monitored Patient Re-evaluated:Patient Re-evaluated prior to induction Oxygen Delivery Method: Circle System Utilized Preoxygenation: Pre-oxygenation with 100% oxygen Induction Type: IV induction Ventilation: Mask ventilation without difficulty LMA: LMA inserted LMA Size: 3.0 Number of attempts: 1 Airway Equipment and Method: Bite block Placement Confirmation: positive ETCO2 Tube secured with: Tape Dental Injury: Teeth and Oropharynx as per pre-operative assessment

## 2024-09-25 NOTE — ED Triage Notes (Signed)
 Pt had breast augmentation yesterday and is now having bleeding/pain/swelling on left breast. Denies fevers/CP/SHOB.

## 2024-09-25 NOTE — Transfer of Care (Signed)
 Immediate Anesthesia Transfer of Care Note  Patient: Alison Greer  Procedure(s) Performed: EVACUATION HEMATOMA OF LEFT BREAST (Left: Breast)  Patient Location: PACU  Anesthesia Type:General  Level of Consciousness: awake, drowsy, and patient cooperative  Airway & Oxygen Therapy: Patient Spontanous Breathing and Patient connected to face mask oxygen  Post-op Assessment: Report given to RN, Post -op Vital signs reviewed and stable, Patient moving all extremities, and Patient moving all extremities X 4  Post vital signs: Reviewed and stable  Last Vitals:  Vitals Value Taken Time  BP 105/60 09/25/24 17:17  Temp    Pulse 72 09/25/24 17:17  Resp 26 09/25/24 17:21  SpO2 100 % 09/25/24 17:17  Vitals shown include unfiled device data.  Last Pain:  Vitals:   09/25/24 1515  TempSrc: Oral  PainSc:          Complications: No notable events documented.

## 2024-09-25 NOTE — Op Note (Signed)
 Operative Note   DATE OF OPERATION: 09/25/2024  SURGICAL DEPARTMENT: Plastic Surgery  LOCATION: [facility]  PREOPERATIVE DIAGNOSES: Left breast hematoma  POSTOPERATIVE DIAGNOSES:  same  PROCEDURE: Evacuation of left breast hematoma  SURGEON: Craig Lorren Shank, MD  ASSISTANT: None  ANESTHESIA:  General.   COMPLICATIONS: None.   INDICATIONS FOR PROCEDURE:  The patient, Alison Greer is a 48 y.o. female born on [birth year], is here for treatment of left breast hematoma.  Patient had implant exchange with subtotal capsulectomy yesterday.  She called me this afternoon with concerns of swelling on the left side and pain.  She sent me photographs which looked like an obvious hematoma and so I advised her to come to the emergency room for treatment.  Diagnosis confirmed preoperatively and scheduled for evacuation of left breast hematoma.  The right side clinically looks fine. MRN: [id number]  CONSENT:  Informed consent was obtained directly from the patient. Risks, benefits and alternatives were fully discussed. Specific risks including but not limited to bleeding, infection, hematoma, seroma, scarring, pain, contracture, asymmetry, wound healing problems, and need for further surgery were all discussed. The patient did have an ample opportunity to have questions answered to satisfaction.   DESCRIPTION OF PROCEDURE:  The patient was taken to the operating room. SCDs were placed and antibiotics were given.  General anesthesia was administered.  The patient's operative site was prepped and draped in a sterile fashion. A time out was performed and all information was confirmed to be correct.  I started by incising along the previously made inframammary fold incision.  I carefully gained access to the pocket and removed the implant.  This was kept on the back table and a cover container bathed in Irrisept.  I then evacuated what I would estimate to be around 400 cc of hematoma.  The pocket  was then irrigated and on first glance there was not an obvious bleeder.  After multiple rounds of irrigation I did find a small ooze coming from the underside of the breast which was cauterized.  I did multiple rounds of saline irrigation followed by hypochlorous acid and Irrisept taking care to cauterize any visible bleeders.  After ensuring hemostasis I placed a drain and secured it with a nylon suture.  The implant was then replaced with a Keller funnel and ensured to be in the appropriate orientation.  I carefully closed the incisions using 3-0 PDS to tack the breast fascia down to the inframammary fold followed by buried 3-0 PDS for deep dermal sutures and a running 3 oh STRATAFIX subcuticular.  At this point the left side looked a little bit smaller than the right side so I obtained an ultrasound probe to examine the right side just to look for any obvious fluid there.  On the right side with the ultrasound guidance I was able to clearly see the shell of the implant just beneath the breast tissue with no fluid in between.  I examined it circumferentially and could not identify any obvious fluid that would contribute to the asymmetry.  I believe the left side was stretched out from the hematoma and that was the reason for the on table asymmetry.  There was some blistering of the skin of the left breast which was debrided and dressed with Xeroform followed by soft compressive wrap.  The patient tolerated the procedure well.  There were no complications. The patient was allowed to wake from anesthesia, extubated and taken to the recovery room in satisfactory condition.

## 2024-09-25 NOTE — ED Provider Notes (Signed)
 [facility] Provider Note   CSN: [id number] Arrival date & time: 09/25/24  1500     Patient presents with: Breast Problem and Post-op Problem   Alison Greer is a 48 y.o. female with past medical history of anemia presents Emergency Department for evaluation of left breast pain, swelling, bleeding that started this morning following a breast augmentation surgery yesterday with Dr. Elisabeth. Was sent here per his recommendation for further evaluation, OR for expected hematoma evacuation. Denies fevers, drainage from site.   HPI     Prior to Admission medications   Medication Sig Start Date End Date Taking? Authorizing Provider  ARIPiprazole (ABILIFY) 2 MG tablet Take 2 mg by mouth daily. 06/05/23   [provider]  Budesonide (RHINOCORT ALLERGY NA) Place 1 Dose into the nose daily. Reported on 12/28/2015    [provider]  buPROPion (WELLBUTRIN) 75 MG tablet Take 150 mg by mouth 2 (two) times daily.     [provider]  cetirizine (ZYRTEC) 10 MG tablet Take 10 mg by mouth daily.    [provider]  Omega-3 Fatty Acids (OMEGA 3 PO) Take 1 capsule by mouth daily. Reported on 12/28/2015    [provider]    Allergies: Azithromycin    Review of Systems  Skin:  Positive for wound.    Updated Vital Signs BP 102/62 (BP Location: Left Arm)   Pulse 86   Temp 98.8 F (37.1 C) (Oral)   Resp 16   Ht 5' 7 (1.702 m)   Wt 45.4 kg   SpO2 98%   Breastfeeding No   BMI 15.66 kg/m   Physical Exam Vitals and nursing note reviewed. Exam conducted with a chaperone present.  Constitutional:      General: She is not in acute distress.    Appearance: Normal appearance.  HENT:     Head: Normocephalic and atraumatic.  Eyes:     Conjunctiva/sclera: Conjunctivae normal.  Cardiovascular:     Rate and Rhythm: Normal rate.  Pulmonary:     Effort: Pulmonary effort is normal. No respiratory distress.      Breath sounds: Normal breath sounds.  Chest:  Breasts:    Left: Swelling, bleeding and tenderness present. No inverted nipple or nipple discharge.     Comments: No overlying cellulitis, erythema. No purulent DC noted to incision site Skin:    Coloration: Skin is not jaundiced or pale.  Neurological:     Mental Status: She is alert and oriented to person, place, and time. Mental status is at baseline.     (all labs ordered are listed, but only abnormal results are displayed) Labs Reviewed - No data to display  EKG: None  Radiology: No results found.   Medications Ordered in the ED - No data to display                                  Medical Decision Making    Patient presents to the ED for concern of left breast pain, bleeding, this involves an extensive number of treatment options, and is a complaint that carries with it a high risk of complications and morbidity.  The differential diagnosis includes mastitis, hematoma, cellulitis, abscess   Co morbidities that complicate the patient evaluation  Recent breast augmentation sx   Additional history obtained:  Additional history obtained from Nursing   External records from outside source  obtained and reviewed including triage RN note    Problem List / ED Course:  Left breast pain and swelling S/p breast augmentation by Dr. Elisabeth Likely 2/2 hematoma Vital signs hemodynamically stable with no fever no tachycardia Pt immediately sent to OR for hematoma evacuation with Dr. Elisabeth    Dispostion:  Sent to OR in care of Dr. Elisabeth for hematoma evacuation.   Final diagnoses:  Breast pain    ED Discharge Orders     None        Minnie Tinnie BRAVO, PA 09/25/24 1611    Towana Ozell BROCKS, MD 09/26/24 [phone]

## 2024-09-25 NOTE — Discharge Instructions (Addendum)
 Avoid strenuous activity.  Can shower in 2 days.  Maintain compression at the surgical site.  Empty and record drain output.

## 2024-09-25 NOTE — Anesthesia Postprocedure Evaluation (Signed)
 Anesthesia Post Note  Patient: Alison Greer  Procedure(s) Performed: EVACUATION HEMATOMA OF LEFT BREAST (Left: Breast)     Patient location during evaluation: PACU Anesthesia Type: General Level of consciousness: awake and alert Pain management: pain level controlled Vital Signs Assessment: post-procedure vital signs reviewed and stable Respiratory status: spontaneous breathing, nonlabored ventilation, respiratory function stable and patient connected to nasal cannula oxygen Cardiovascular status: blood pressure returned to baseline and stable Postop Assessment: no apparent nausea or vomiting Anesthetic complications: no   No notable events documented.  Last Vitals:  Vitals:   09/25/24 1750 09/25/24 1805  BP: (!) 96/58 100/60  Pulse: 77 84  Resp: 13 16  Temp:  36.5 C  SpO2: 100% 100%    Last Pain:  Vitals:   09/25/24 1805  TempSrc:   PainSc: 4                  Lemuel Boodram P Quanita Barona

## 2024-09-25 NOTE — Anesthesia Preprocedure Evaluation (Addendum)
 Anesthesia Evaluation  Patient identified by MRN, date of birth, ID band Patient awake    Reviewed: Allergy & Precautions, NPO status , Patient's Chart, lab work & pertinent test results  Airway Mallampati: II  TM Distance: >3 FB Neck ROM: Full    Dental no notable dental hx.    Pulmonary neg pulmonary ROS   Pulmonary exam normal        Cardiovascular negative cardio ROS  Rhythm:Regular Rate:Normal     Neuro/Psych negative neurological ROS  negative psych ROS   GI/Hepatic negative GI ROS, Neg liver ROS,,,  Endo/Other  negative endocrine ROS    Renal/GU negative Renal ROS  negative genitourinary   Musculoskeletal Bleeding s/p breast augmentation    Abdominal   Peds  Hematology  (+) Blood dyscrasia, anemia   Anesthesia Other Findings   Reproductive/Obstetrics                              Anesthesia Physical Anesthesia Plan  ASA: 2  Anesthesia Plan: General   Post-op Pain Management: Ofirmev  IV (intra-op)*   Induction: Intravenous  PONV Risk Score and Plan: 3 and Ondansetron , Dexamethasone , Midazolam  and Treatment may vary due to age or medical condition  Airway Management Planned: Mask and LMA  Additional Equipment: None  Intra-op Plan:   Post-operative Plan: Extubation in OR  Informed Consent: I have reviewed the patients History and Physical, chart, labs and discussed the procedure including the risks, benefits and alternatives for the proposed anesthesia with the patient or authorized representative who has indicated his/her understanding and acceptance.     Dental advisory given  Plan Discussed with: CRNA  Anesthesia Plan Comments:          Anesthesia Quick Evaluation

## 2024-09-26 ENCOUNTER — Encounter (HOSPITAL_COMMUNITY): Payer: Self-pay | Admitting: Plastic Surgery

## 2024-09-26 LAB — SURGICAL PATHOLOGY

## 2024-09-26 MED ORDER — CHLORHEXIDINE GLUCONATE 0.12 % MT SOLN
OROMUCOSAL | Status: AC
  Filled 2024-09-26: qty 15

## 2024-10-03 NOTE — H&P (Signed)
 Reason for Consult/CC: Left breast hematoma  Alison Greer is an 48 y.o. female.  HPI: Patient presents with left breast hematoma after bilateral implant with capsulectomy yesterday.  She has pain and swelling on the left side.  Allergies:  Allergies  Allergen Reactions   Azithromycin Nausea And Vomiting    Medications: No current facility-administered medications for this encounter.  Current Outpatient Medications:    ARIPiprazole (ABILIFY) 2 MG tablet, Take 2 mg by mouth daily., Disp: , Rfl:    Budesonide (RHINOCORT ALLERGY NA), Place 1 Dose into the nose daily. Reported on 12/28/2015, Disp: , Rfl:    buPROPion (WELLBUTRIN) 75 MG tablet, Take 150 mg by mouth 2 (two) times daily. , Disp: , Rfl:    cetirizine (ZYRTEC) 10 MG tablet, Take 10 mg by mouth daily., Disp: , Rfl:    Omega-3 Fatty Acids (OMEGA 3 PO), Take 1 capsule by mouth daily. Reported on 12/28/2015, Disp: , Rfl:   Past Medical History:  Diagnosis Date   Anemia     Past Surgical History:  Procedure Laterality Date   BREAST SURGERY     HEMATOMA EVACUATION Left 09/25/2024   Procedure: EVACUATION HEMATOMA OF LEFT BREAST;  Surgeon: Elisabeth Craig RAMAN, MD;  Location: [facility] OR;  Service: Plastics;  Laterality: Left;  OF LEFT BREAST   RHINOPLASTY  2022   WISDOM TOOTH EXTRACTION      Family History  Problem Relation Age of Onset   Skin cancer Father    Skin cancer Maternal Grandmother    Cancer Paternal Grandfather     Social History:  reports that she has never smoked. She has never used smokeless tobacco. She reports that she does not drink alcohol and does not use drugs.  Physical Exam Blood pressure 100/60, pulse 84, temperature 97.7 F (36.5 C), resp. rate 16, height 5' 7 (1.702 m), weight 45.4 kg, SpO2 100%, not currently breastfeeding. General: Obvious hematoma on the left.  Is significantly larger and there is some blistering of the skin.  Needs urgent evacuation.  No results found for this or any previous visit  (from the past 48 hours).  No results found.  Assessment/Plan: Plan to go to the operating room for drainage of left breast hematoma.  Risks and benefits discussed with the patient.  Elisabeth RAMAN Craig 10/03/2024, 6:10 PM

## 2024-10-13 ENCOUNTER — Ambulatory Visit: Admitting: Obstetrics

## 2024-10-28 ENCOUNTER — Other Ambulatory Visit (HOSPITAL_COMMUNITY)
Admission: RE | Admit: 2024-10-28 | Discharge: 2024-10-28 | Disposition: A | Discharge status: Home / Self Care | Source: Ambulatory Visit | Attending: Obstetrics and Gynecology | Admitting: Obstetrics and Gynecology

## 2024-10-28 ENCOUNTER — Ambulatory Visit: Admitting: Obstetrics

## 2024-10-28 ENCOUNTER — Encounter: Payer: Self-pay | Admitting: Obstetrics

## 2024-10-28 VITALS — BP 118/75 | HR 69 | Ht 67.0 in | Wt 93.5 lb

## 2024-10-28 DIAGNOSIS — Z01419 Encounter for gynecological examination (general) (routine) without abnormal findings: Secondary | ICD-10-CM

## 2024-10-28 NOTE — Progress Notes (Unsigned)
 Pt presents for annual. Pt has no questions or concerns at this time.

## 2024-10-28 NOTE — Progress Notes (Unsigned)
 "  Subjective:        Alison Greer is a 48 y.o. female here for a routine exam.  Current complaints: None.    Personal health questionnaire:  Is patient Ashkenazi Jewish, have a family history of breast and/or ovarian cancer: no Is there a family history of uterine cancer diagnosed at age < 32, gastrointestinal cancer, urinary tract cancer, family member who is a Personnel Officer syndrome-associated carrier: no Is the patient overweight and hypertensive, family history of diabetes, personal history of gestational diabetes, preeclampsia or PCOS: no Is patient over 57, have PCOS,  family history of premature CHD under age 32, diabetes, smoke, have hypertension or peripheral artery disease:  no At any time, has a partner hit, kicked or otherwise hurt or frightened you?: no Over the past 2 weeks, have you felt down, depressed or hopeless?: no Over the past 2 weeks, have you felt little interest or pleasure in doing things?:no   Gynecologic History No LMP recorded (lmp unknown). (Menstrual status: Irregular Periods). Contraception: vasectomy Last Pap: 2024. Results were: normal Last mammogram: 2024. Results were: normal  Obstetric History OB History  Gravida Para Term Preterm AB Living  3 3 3   3   SAB IAB Ectopic Multiple Live Births     0 3    # Outcome Date GA Lbr Len/2nd Weight Sex Type Anes PTL Lv  3 Term 11/17/15 [redacted]w[redacted]d 01:06 / 00:28 5 lb 8.7 oz (2.515 kg) F Vag-Spont EPI  LIV  2 Term 05/13/10 [redacted]w[redacted]d  6 lb 4 oz (2.835 kg) M Vag-Spont EPI N LIV     Complications: Umbilical cord complication during labor and delivery  1 Term 06/26/05 [redacted]w[redacted]d  6 lb 3 oz (2.807 kg) M Vag-Vacuum EPI N LIV    Past Medical History:  Diagnosis Date   Anemia     Past Surgical History:  Procedure Laterality Date   BREAST SURGERY     HEMATOMA EVACUATION Left 09/25/2024   Procedure: EVACUATION HEMATOMA OF LEFT BREAST;  Surgeon: Elisabeth Craig RAMAN, MD;  Location: [facility] OR;  Service: Plastics;  Laterality: Left;  OF  LEFT BREAST   RHINOPLASTY  2022   WISDOM TOOTH EXTRACTION      Current Medications[1] Allergies[2]  Social History   Tobacco Use   Smoking status: Never   Smokeless tobacco: Never  Substance Use Topics   Alcohol use: No    Alcohol/week: 0.0 standard drinks of alcohol    Family History  Problem Relation Age of Onset   Skin cancer Father    Skin cancer Maternal Grandmother    Cancer Paternal Grandfather       Review of Systems  Constitutional: negative for fatigue and weight loss Respiratory: negative for cough and wheezing Cardiovascular: negative for chest pain, fatigue and palpitations Gastrointestinal: negative for abdominal pain and change in bowel habits Musculoskeletal:negative for myalgias Neurological: negative for gait problems and tremors Behavioral/Psych: negative for abusive relationship, depression Endocrine: negative for temperature intolerance    Genitourinary:negative for abnormal menstrual periods, genital lesions, hot flashes, sexual problems and vaginal discharge Integument/breast: negative for breast lump, breast tenderness, nipple discharge and skin lesion(s)    Objective:       BP 118/75   Pulse 69   Ht 5' 7 (1.702 m)   Wt 93 lb 8 oz (42.4 kg)   LMP  (LMP Unknown)   BMI 14.64 kg/m  General:   alert  Skin:   no rash or abnormalities  Lungs:   clear to  auscultation bilaterally  Heart:   regular rate and rhythm, S1, S2 normal, no murmur, click, rub or gallop  Breasts:   normal without suspicious masses, skin or nipple changes or axillary nodes  Abdomen:  normal findings: no organomegaly, soft, non-tender and no hernia  Pelvis:  External genitalia: normal general appearance Urinary system: urethral meatus normal and bladder without fullness, nontender Vaginal: normal without tenderness, induration or masses Cervix: normal appearance Adnexa: normal bimanual exam Uterus: anteverted and non-tender, normal size   Lab Review Urine pregnancy  test Labs reviewed yes Radiologic studies reviewed yes  I have spent a total of 20 minutes of face-to-face time, excluding clinical staff time, reviewing notes and preparing to see patient, ordering tests and/or medications, and counseling the patient.   Assessment:    1. Encounter for gynecological examination with Papanicolaou smear of cervix (Primary) Rx: - Cytology - PAP( [place])    Plan:    Education reviewed: calcium supplements, depression evaluation, low fat, low cholesterol diet, safe sex/STD prevention, self breast exams, skin cancer screening, and weight bearing exercise. Follow up in: 1 year.    CARLIN RONAL CENTERS, MD, FACOG Attending Obstetrician & Gynecologist, [facility] for [facility], [facility], [place] 10/28/2024     [1]  Current Outpatient Medications:    ARIPiprazole (ABILIFY) 2 MG tablet, Take 2 mg by mouth daily., Disp: , Rfl:    Budesonide (RHINOCORT ALLERGY NA), Place 1 Dose into the nose daily. Reported on 12/28/2015, Disp: , Rfl:    buPROPion (WELLBUTRIN) 75 MG tablet, Take 150 mg by mouth 2 (two) times daily. , Disp: , Rfl:    cetirizine (ZYRTEC) 10 MG tablet, Take 10 mg by mouth daily., Disp: , Rfl:    Omega-3 Fatty Acids (OMEGA 3 PO), Take 1 capsule by mouth daily. Reported on 12/28/2015, Disp: , Rfl:  [2]  Allergies Allergen Reactions   Azithromycin Nausea And Vomiting   "

## 2024-10-31 ENCOUNTER — Ambulatory Visit: Payer: Self-pay | Admitting: Obstetrics

## 2024-10-31 LAB — CYTOLOGY - PAP
Comment: NEGATIVE
Diagnosis: NEGATIVE
High risk HPV: NEGATIVE
